# Patient Record
Sex: Female | Born: 1950 | Race: White | Hispanic: No | Marital: Married
Health system: Southern US, Community
[De-identification: ages and names within clinical notes are randomized; demographics above are authoritative.]

---

## 1998-08-10 ENCOUNTER — Other Ambulatory Visit: Admission: RE | Admit: 1998-08-10 | Discharge: 1998-08-10 | Payer: Self-pay | Admitting: *Deleted

## 1999-12-20 ENCOUNTER — Other Ambulatory Visit: Admission: RE | Admit: 1999-12-20 | Discharge: 1999-12-20 | Payer: Self-pay | Admitting: *Deleted

## 2001-03-30 ENCOUNTER — Other Ambulatory Visit: Admission: RE | Admit: 2001-03-30 | Discharge: 2001-03-30 | Payer: Self-pay | Admitting: *Deleted

## 2001-07-07 ENCOUNTER — Encounter: Payer: Self-pay | Admitting: *Deleted

## 2001-07-07 ENCOUNTER — Ambulatory Visit: Admission: RE | Admit: 2001-07-07 | Discharge: 2001-07-07 | Payer: Self-pay | Admitting: Gynecologic Oncology

## 2001-07-14 ENCOUNTER — Inpatient Hospital Stay (HOSPITAL_COMMUNITY): Admission: RE | Admit: 2001-07-14 | Discharge: 2001-07-18 | Payer: Self-pay | Admitting: *Deleted

## 2001-07-14 ENCOUNTER — Encounter (INDEPENDENT_AMBULATORY_CARE_PROVIDER_SITE_OTHER): Payer: Self-pay | Admitting: Specialist

## 2001-07-14 ENCOUNTER — Encounter (INDEPENDENT_AMBULATORY_CARE_PROVIDER_SITE_OTHER): Payer: Self-pay

## 2001-07-21 ENCOUNTER — Ambulatory Visit: Admission: RE | Admit: 2001-07-21 | Discharge: 2001-07-21 | Payer: Self-pay | Admitting: Gynecology

## 2001-07-29 ENCOUNTER — Ambulatory Visit: Admission: RE | Admit: 2001-07-29 | Discharge: 2001-07-29 | Payer: Self-pay | Admitting: Gynecology

## 2001-08-06 ENCOUNTER — Ambulatory Visit (HOSPITAL_COMMUNITY): Admission: RE | Admit: 2001-08-06 | Discharge: 2001-08-06 | Payer: Self-pay | Admitting: Oncology

## 2001-08-06 ENCOUNTER — Encounter: Payer: Self-pay | Admitting: Oncology

## 2001-08-11 ENCOUNTER — Encounter: Payer: Self-pay | Admitting: Oncology

## 2001-08-11 ENCOUNTER — Ambulatory Visit (HOSPITAL_COMMUNITY): Admission: RE | Admit: 2001-08-11 | Discharge: 2001-08-11 | Payer: Self-pay | Admitting: Oncology

## 2001-09-02 ENCOUNTER — Ambulatory Visit: Admission: RE | Admit: 2001-09-02 | Discharge: 2001-09-02 | Payer: Self-pay | Admitting: Gynecology

## 2001-10-20 ENCOUNTER — Ambulatory Visit: Admission: RE | Admit: 2001-10-20 | Discharge: 2001-10-20 | Payer: Self-pay | Admitting: Gynecology

## 2001-11-17 ENCOUNTER — Other Ambulatory Visit: Admission: RE | Admit: 2001-11-17 | Discharge: 2001-11-17 | Payer: Self-pay | Admitting: Oncology

## 2001-11-25 ENCOUNTER — Ambulatory Visit (HOSPITAL_COMMUNITY): Admission: RE | Admit: 2001-11-25 | Discharge: 2001-11-25 | Payer: Self-pay | Admitting: Oncology

## 2001-11-25 ENCOUNTER — Encounter: Payer: Self-pay | Admitting: Oncology

## 2001-12-03 ENCOUNTER — Ambulatory Visit (HOSPITAL_COMMUNITY): Admission: RE | Admit: 2001-12-03 | Discharge: 2001-12-03 | Payer: Self-pay | Admitting: Oncology

## 2001-12-03 ENCOUNTER — Encounter: Payer: Self-pay | Admitting: Oncology

## 2001-12-15 ENCOUNTER — Ambulatory Visit: Admission: RE | Admit: 2001-12-15 | Discharge: 2001-12-15 | Payer: Self-pay | Admitting: Gynecology

## 2002-01-12 ENCOUNTER — Ambulatory Visit: Admission: RE | Admit: 2002-01-12 | Discharge: 2002-01-12 | Payer: Self-pay | Admitting: Gynecology

## 2002-03-02 ENCOUNTER — Ambulatory Visit: Admission: RE | Admit: 2002-03-02 | Discharge: 2002-03-02 | Payer: Self-pay | Admitting: Oncology

## 2002-03-09 ENCOUNTER — Ambulatory Visit: Admission: RE | Admit: 2002-03-09 | Discharge: 2002-03-09 | Payer: Self-pay | Admitting: Gynecology

## 2002-04-01 ENCOUNTER — Encounter: Payer: Self-pay | Admitting: Oncology

## 2002-04-01 ENCOUNTER — Ambulatory Visit (HOSPITAL_COMMUNITY): Admission: RE | Admit: 2002-04-01 | Discharge: 2002-04-01 | Payer: Self-pay | Admitting: Oncology

## 2002-04-06 ENCOUNTER — Ambulatory Visit: Admission: RE | Admit: 2002-04-06 | Discharge: 2002-04-06 | Payer: Self-pay | Admitting: Gynecology

## 2002-07-05 ENCOUNTER — Encounter: Payer: Self-pay | Admitting: *Deleted

## 2002-07-05 ENCOUNTER — Ambulatory Visit (HOSPITAL_COMMUNITY): Admission: RE | Admit: 2002-07-05 | Discharge: 2002-07-05 | Payer: Self-pay | Admitting: *Deleted

## 2002-07-13 ENCOUNTER — Other Ambulatory Visit: Admission: RE | Admit: 2002-07-13 | Discharge: 2002-07-13 | Payer: Self-pay | Admitting: Oncology

## 2002-07-28 ENCOUNTER — Encounter: Payer: Self-pay | Admitting: Oncology

## 2002-07-28 ENCOUNTER — Ambulatory Visit (HOSPITAL_COMMUNITY): Admission: RE | Admit: 2002-07-28 | Discharge: 2002-07-28 | Payer: Self-pay | Admitting: Oncology

## 2002-10-13 ENCOUNTER — Ambulatory Visit: Admission: RE | Admit: 2002-10-13 | Discharge: 2002-10-13 | Payer: Self-pay | Admitting: Gynecology

## 2003-04-06 ENCOUNTER — Ambulatory Visit: Admission: RE | Admit: 2003-04-06 | Discharge: 2003-04-06 | Payer: Self-pay | Admitting: Gynecology

## 2003-10-05 ENCOUNTER — Ambulatory Visit (HOSPITAL_COMMUNITY): Admission: RE | Admit: 2003-10-05 | Discharge: 2003-10-05 | Payer: Self-pay | Admitting: Oncology

## 2003-10-05 ENCOUNTER — Ambulatory Visit: Admission: RE | Admit: 2003-10-05 | Discharge: 2003-10-05 | Payer: Self-pay | Admitting: Gynecology

## 2003-11-16 ENCOUNTER — Other Ambulatory Visit: Admission: RE | Admit: 2003-11-16 | Discharge: 2003-11-16 | Payer: Self-pay | Admitting: *Deleted

## 2003-12-16 ENCOUNTER — Inpatient Hospital Stay (HOSPITAL_COMMUNITY): Admission: RE | Admit: 2003-12-16 | Discharge: 2003-12-17 | Payer: Self-pay | Admitting: General Surgery

## 2004-07-17 ENCOUNTER — Ambulatory Visit: Admission: RE | Admit: 2004-07-17 | Discharge: 2004-07-17 | Payer: Self-pay | Admitting: Gynecology

## 2004-10-23 ENCOUNTER — Encounter: Admission: RE | Admit: 2004-10-23 | Discharge: 2004-10-23 | Payer: Self-pay | Admitting: *Deleted

## 2004-11-28 IMAGING — CT CT PELVIS W/ CM
1 of 4 series · 13 of 32 positions shown, 18 images · IV contrast (omnipaque)
Comparison: 07/05/02.

CLINICAL DATA: 52-year-old female.  Ovarian carcinoma.  Follow-up exam.
CT ABDOMEN AND PELVIS WITH IV CONTRAST
TECHNIQUE: 5 mm collimation was utilized to scan the abdomen and pelvis helically following the administration of 150 cc of Omnipaque 300 contrast.

[Series 2: abd/pelvis 5.0 b30f · axial · 0.59mm/px · z∈[-450,-50]mm · 13 of 92 slices shown, 18 images]
[im 6/92  soft-tissue]
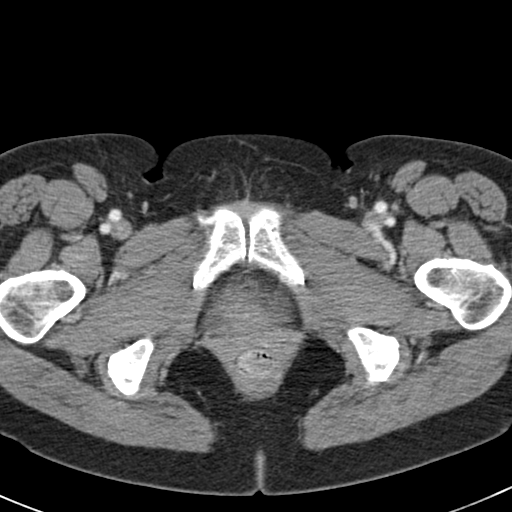
[im 6/92  bone]
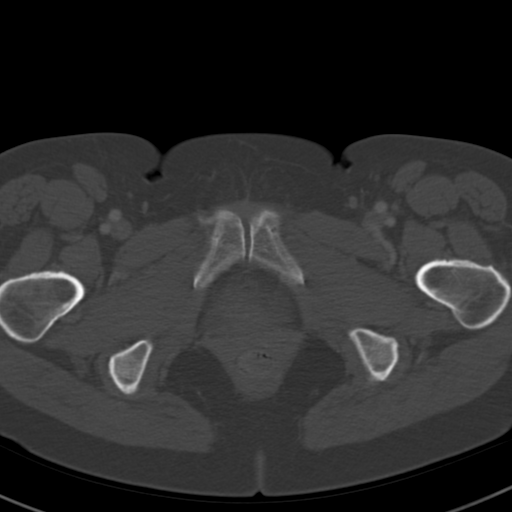
[im 17/92  soft-tissue]
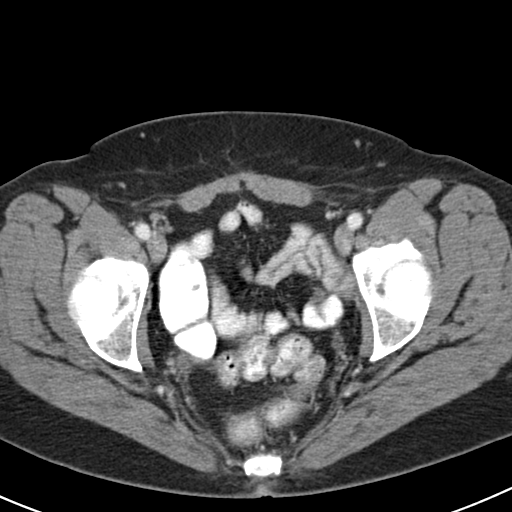
[im 22/92  soft-tissue]
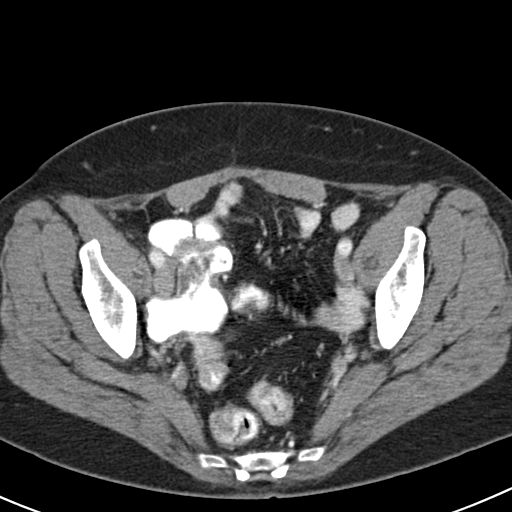
[im 27/92  soft-tissue]
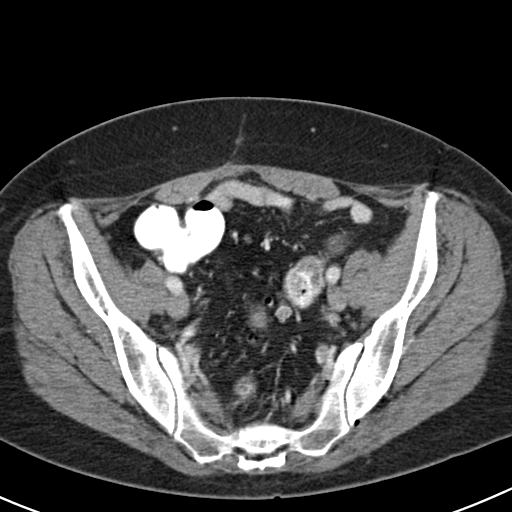
[im 38/92  soft-tissue]
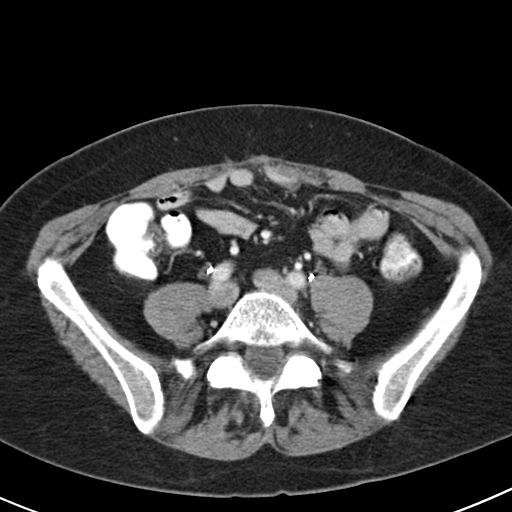
[im 43/92  soft-tissue]
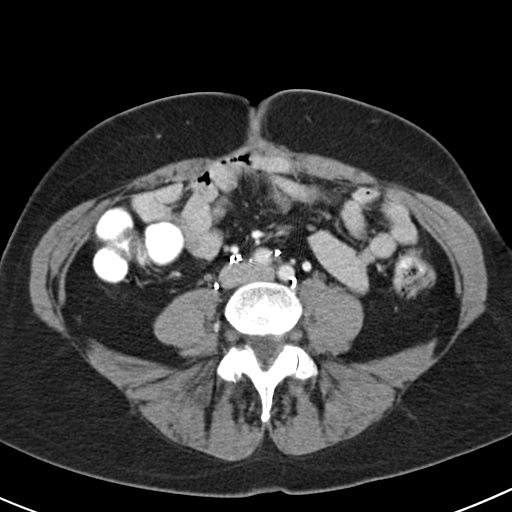
[im 49/92  soft-tissue]
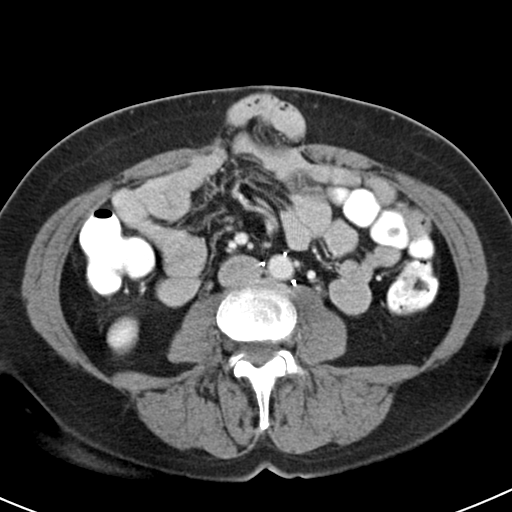
[im 59/92  soft-tissue]
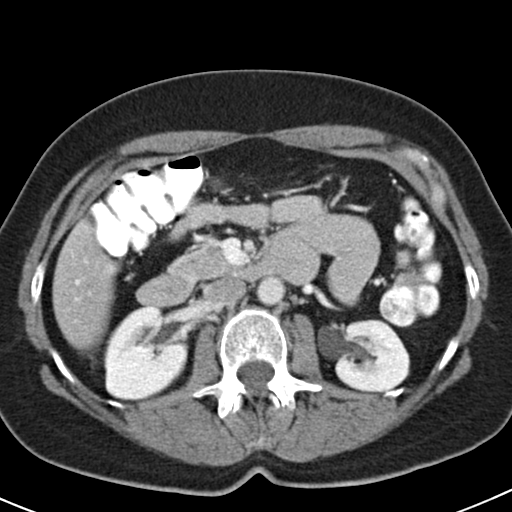
[im 65/92  soft-tissue]
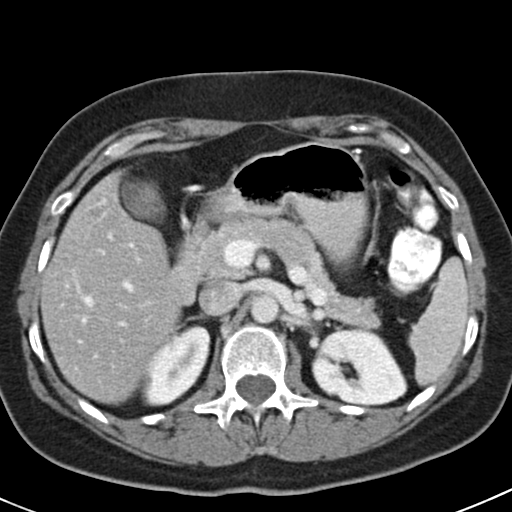
[im 65/92  bone]
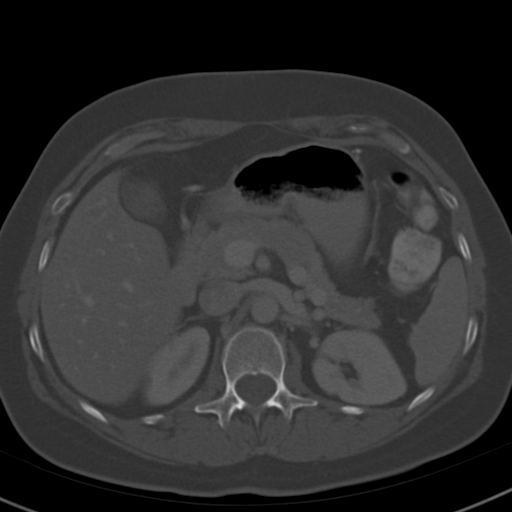
[im 70/92  soft-tissue]
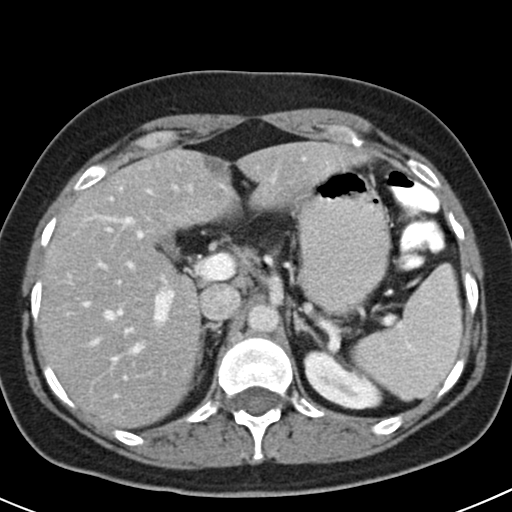
[im 70/92  lung]
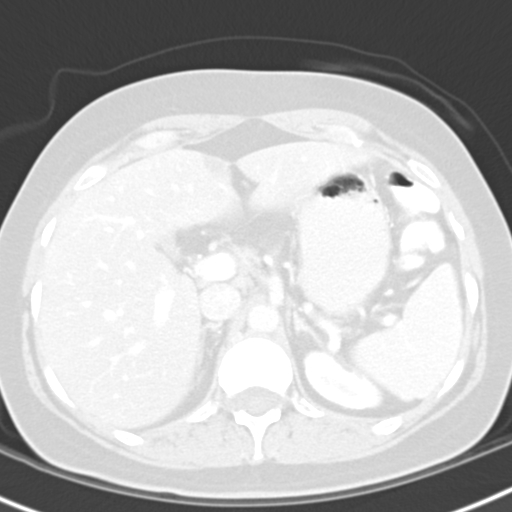
[im 75/92  lung]
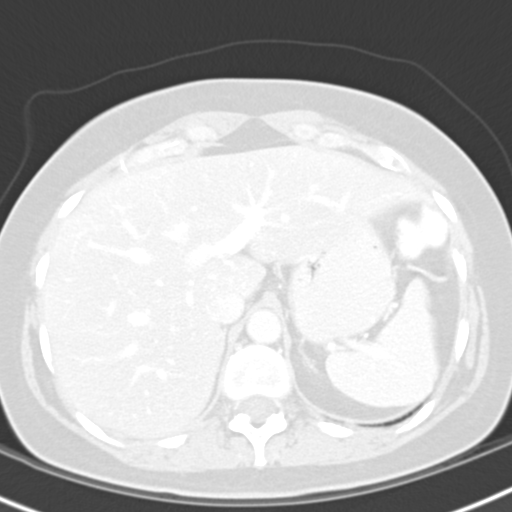
[im 81/92  soft-tissue]
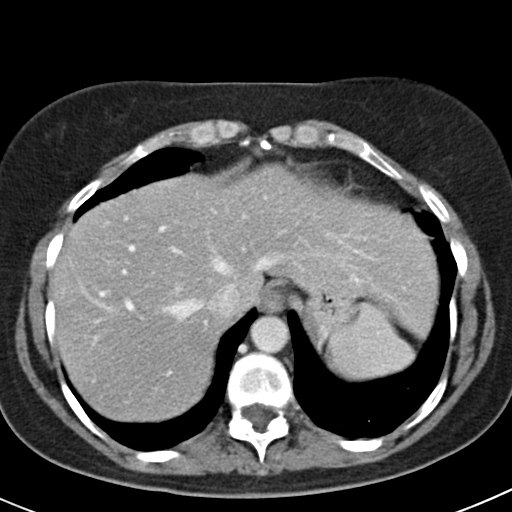
[im 81/92  lung]
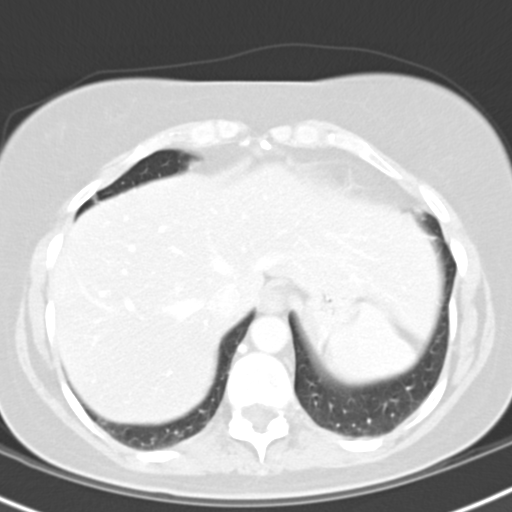
[im 86/92  soft-tissue]
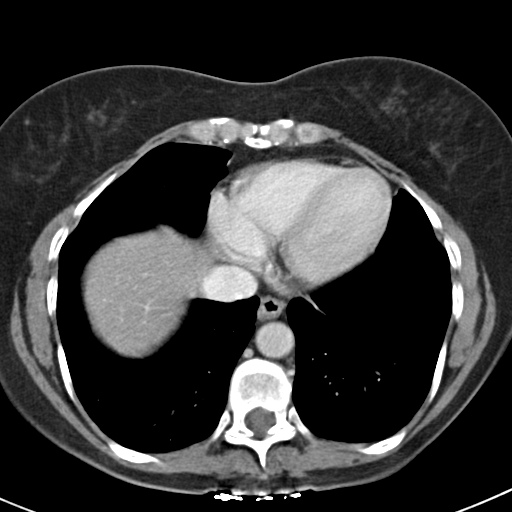
[im 86/92  lung]
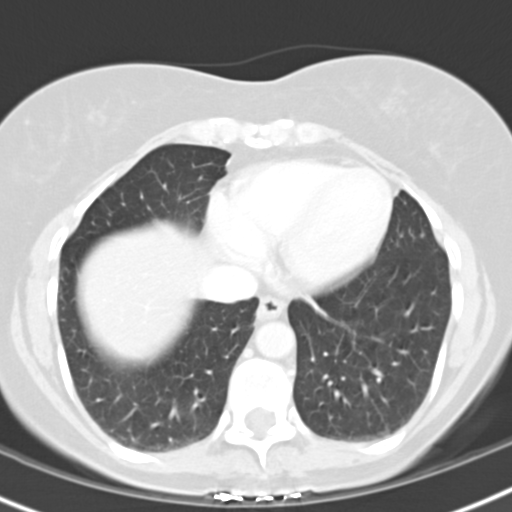

[13 of 32 positions shown; findings below may reference images not displayed]

FINDINGS: ABDOMEN WITH CONTRAST
Lung bases demonstrate minor posterior dependent atelectasis.  No pericardial or pleural fluid.  The heart size is normal.  
In the abdomen, the liver demonstrates diffuse low attenuation, but to a lesser degree compared to 07/05/02, consistent with improving fatty infiltration.  Hepatic and portal veins are patent.  No biliary dilatation.  Along the ligamentum teres in the medial segment of the left hepatic lobe, image 22, there is a subcapsular low attenuation area of the liver parenchyma consistent with focal fatty deposition.  This is stable in appearance.  The spleen, adrenal glands, pancreas, biliary system, and gallbladder are all normal.  Kidneys demonstrate symmetric contrast excretion.  Bilateral extrarenal pelves are noted.  In the lower midline of the abdomen, there is a ventral hernia containing a portion of small bowel without obstruction.  The hernia has a diameter of approximately 5-6 cm.  Postoperative clips are present from retroperitoneal lymph node dissection about the aorta and IVC.  There is no evidence of ascites, definite lymphadenopathy, or mesenteric/omental thickening. 
IMPRESSION
1.  Improving fatty infiltration of the liver with focal fatty deposition along the ligamentum teres.  
2.  Midline ventral hernia containing small bowel, but no obstruction.
3.  Postoperative changes as described.
CT PELVIS WITH CONTRAST 
Postoperative changes are present from a total hysterectomy.  There is improvement in the previous rectosigmoid colon wall thickening.  The bowel appears normal.  There is minor diverticulosis.  No ascites or lymphadenopathy.  There are stable small inguinal lymph nodes bilaterally.  
IMPRESSION
No acute finding in the pelvis.  Stable exam.

## 2005-01-04 ENCOUNTER — Ambulatory Visit: Payer: Self-pay | Admitting: Hematology & Oncology

## 2005-02-18 ENCOUNTER — Other Ambulatory Visit: Admission: RE | Admit: 2005-02-18 | Discharge: 2005-02-18 | Payer: Self-pay | Admitting: *Deleted

## 2005-07-04 ENCOUNTER — Ambulatory Visit: Payer: Self-pay | Admitting: Oncology

## 2005-07-12 ENCOUNTER — Ambulatory Visit: Admission: RE | Admit: 2005-07-12 | Discharge: 2005-07-12 | Payer: Self-pay | Admitting: Gynecology

## 2005-12-17 IMAGING — CT CT ABDOMEN W/ CM
1 of 3 series · 13 of 32 positions shown, 18 images · IV contrast (GASTRO. & [ID] OMNI 300)
Comparison: CT abdomen and pelvis [HOSPITAL] 10/05/2003.

CLINICAL DATA: History of ovarian cancer. Persistent abdominal and pelvic pain.

CT ABDOMEN AND PELVIS WITH CONTRAST 10/23/2004:
TECHNIQUE: Multidetector helical CT of the abdomen and pelvis was performed
during bolus administration of intravenous contrast. Oral contrast was given.
Delayed imaging through the kidneys was performed.
Contrast:  100 cc Cmnipaque-NII.

[Series 2: — · axial · 0.70mm/px · z∈[-391,+9]mm · 13 of 90 slices shown, 18 images]
[im 5/90  soft-tissue]
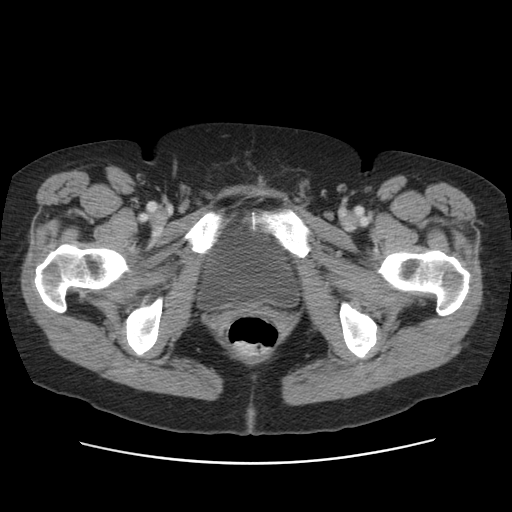
[im 5/90  bone]
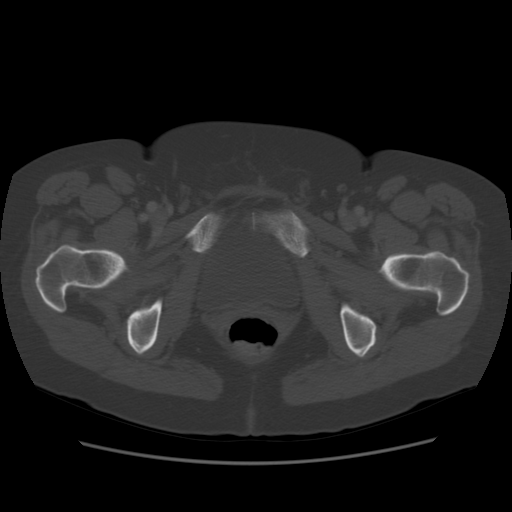
[im 14/90  soft-tissue]
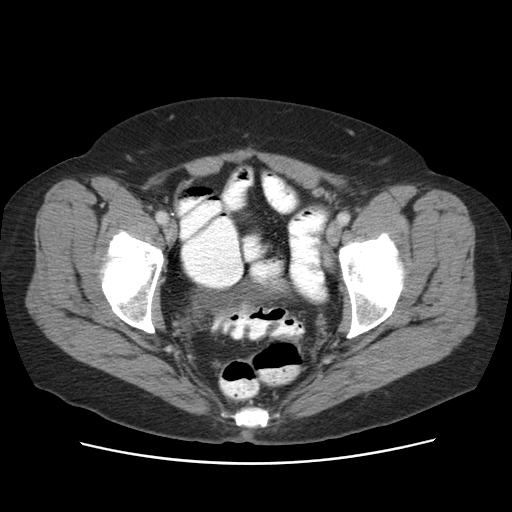
[im 18/90  soft-tissue]
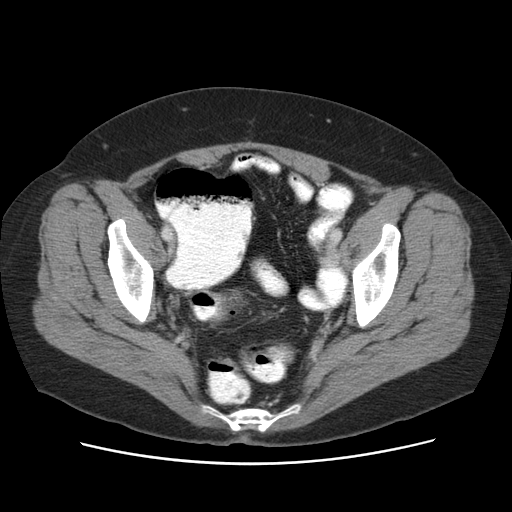
[im 27/90  soft-tissue]
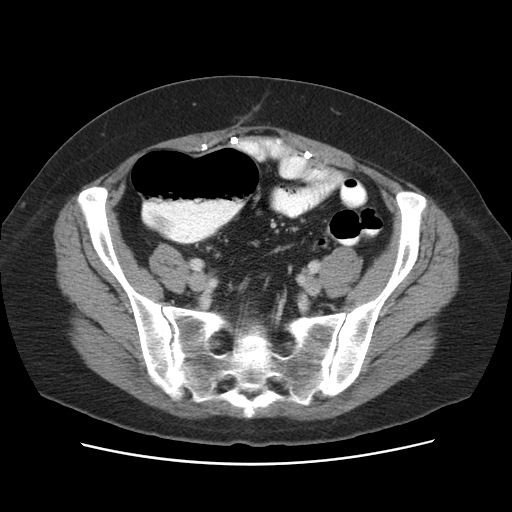
[im 36/90  soft-tissue]
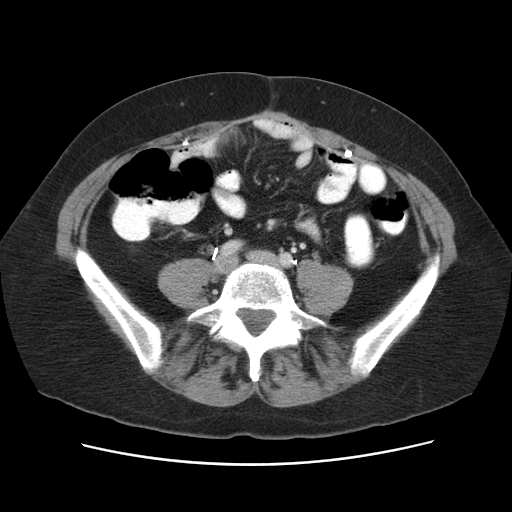
[im 41/90  soft-tissue]
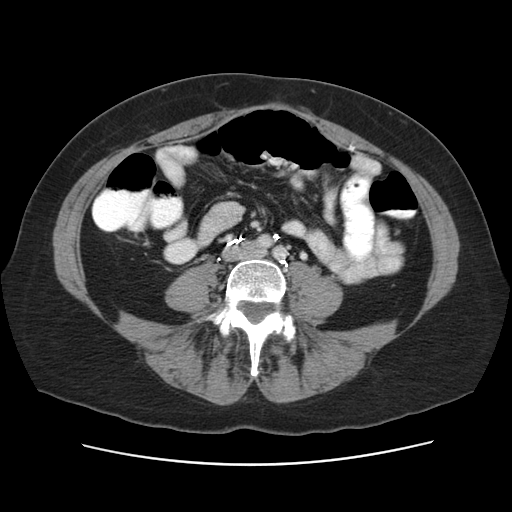
[im 49/90  soft-tissue]
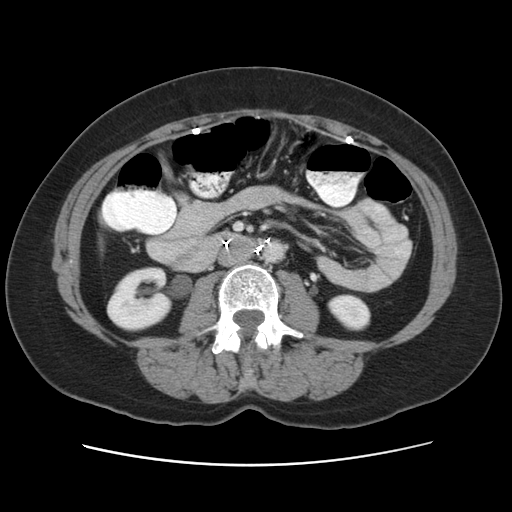
[im 54/90  soft-tissue]
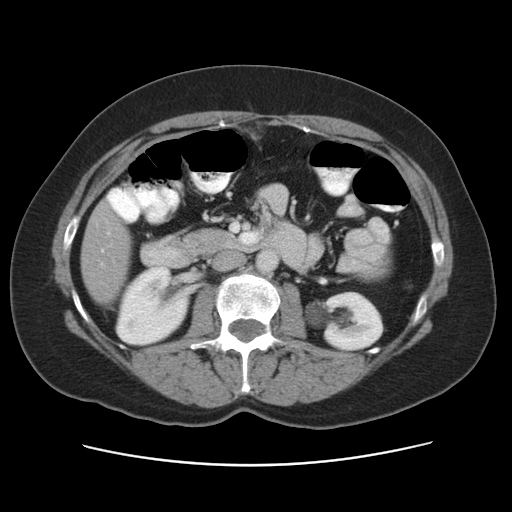
[im 63/90  soft-tissue]
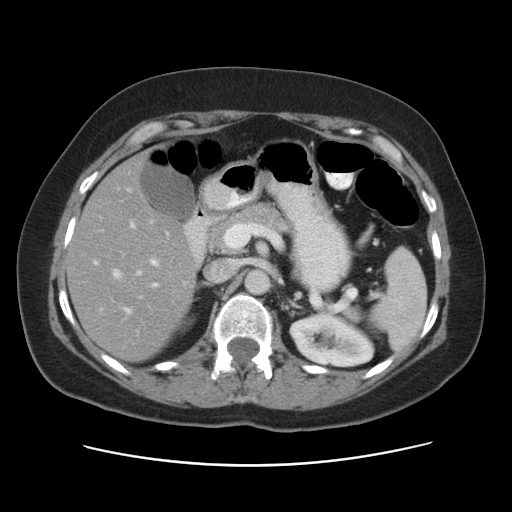
[im 63/90  bone]
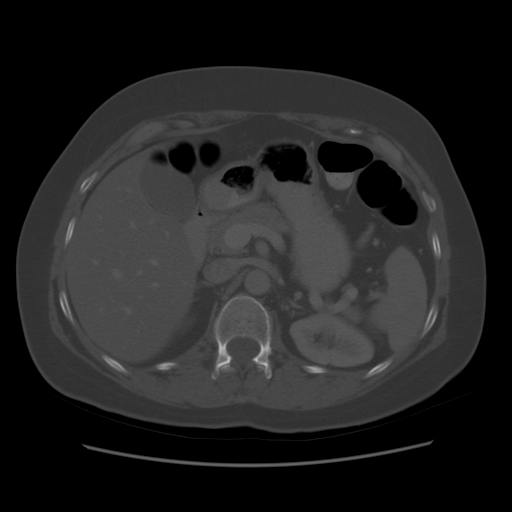
[im 72/90  soft-tissue]
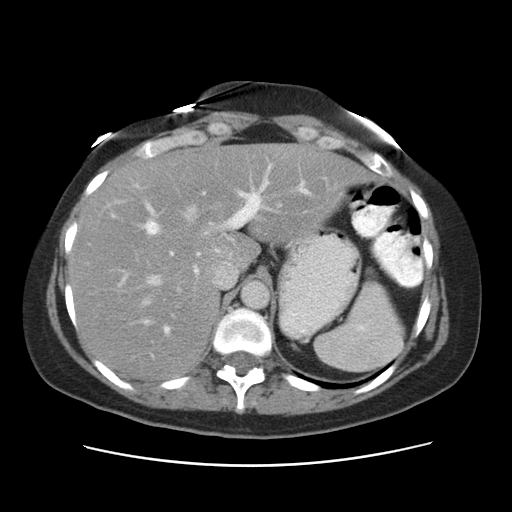
[im 72/90  lung]
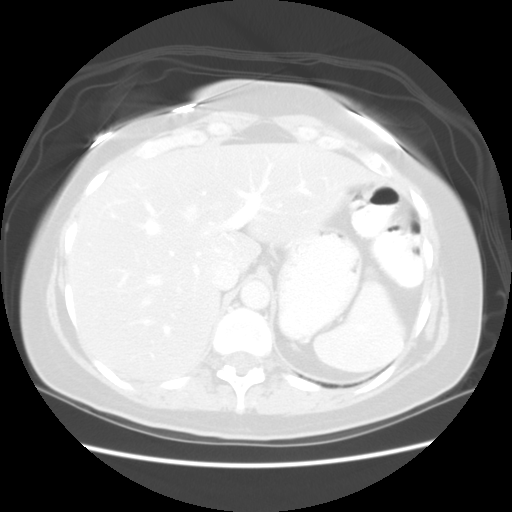
[im 76/90  soft-tissue]
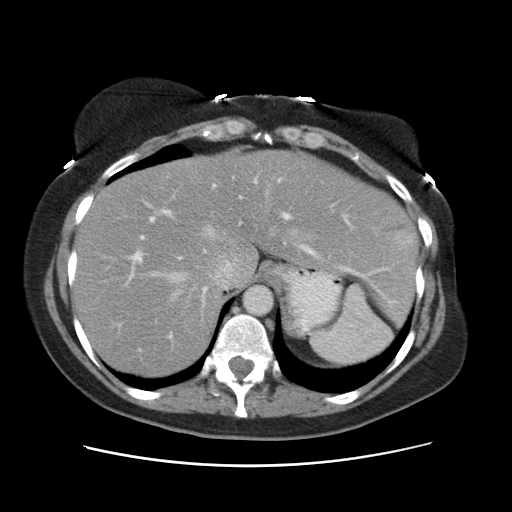
[im 76/90  lung]
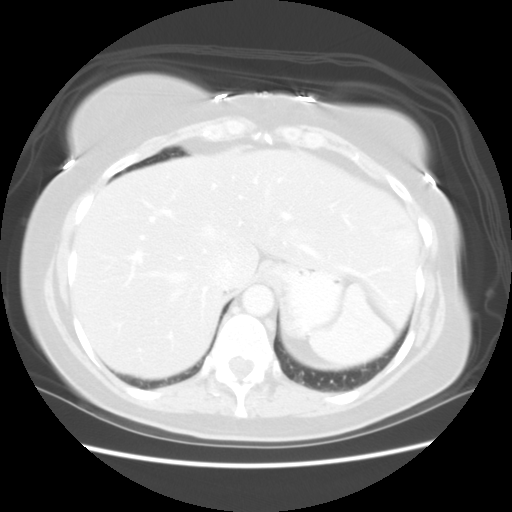
[im 81/90  lung]
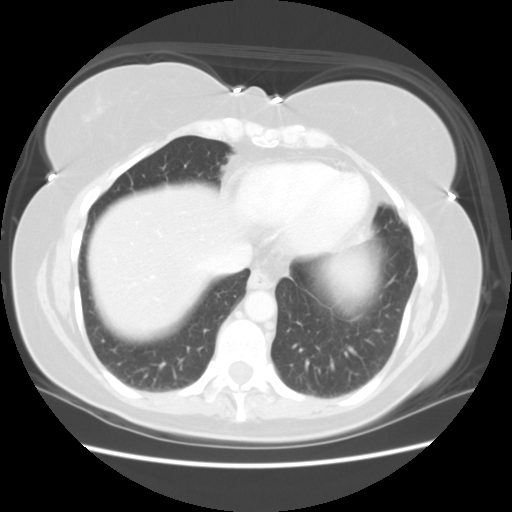
[im 85/90  soft-tissue]
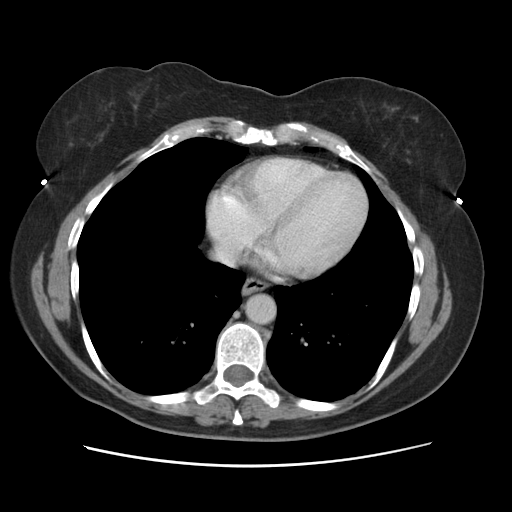
[im 85/90  lung]
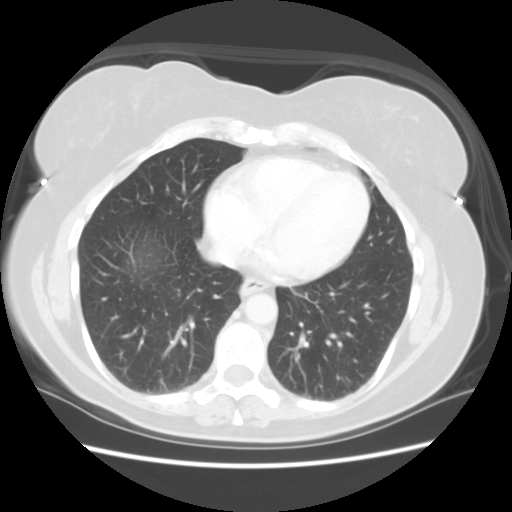

[13 of 32 positions shown; findings below may reference images not displayed]

CT ABDOMEN:

Diffuse fatty infiltration of the liver is again demonstrated; no focal hepatic
abnormalities are identified. The spleen is unremarkable; again demonstrated is
a small focus of accessory splenic tissue immediately inferior to the lower tip
of the spleen. The pancreas has a normal appearance. The gallbladder is
unremarkable by CT and there is no biliary ductal dilation. Both adrenal glands
are normal. Both kidneys are unremarkable; extrarenal pelves are again noted
bilaterally. Since the previous CT, the ventral abdominal wall hernia has
resolved; thinning of the anterior rectus sheath is noted. The stomach and the
visualized colon and small bowel are unremarkable in the upper abdomen. Surgical
clips from the retroperitoneal node dissection are again noted. There are mildly
enlarged lymph nodes in the portacaval region which are unchanged, the largest
measuring approximately 1.5 x 0.8 cm. Subcentimeter nodes are present in the
retroperitoneum. No significant lymphadenopathy is identified. There is no
ascites. The visualized lung bases appear clear apart from minimal left basilar
scarring.
IMPRESSION: 1. No acute abnormalities in the abdomen.

2. Diffuse fatty infiltration of the liver, stable.

3. Resolution of the ventral abdominal wall hernia since 10/05/2003.

CT PELVIS:

The patient has had ventral abdominal wall hernia repair with mesh. There is no
evidence of recurrent hernia in the pelvis. Surgical clips from the
retroperitoneal node dissection are again noted. There is no evidence of a
recurrent mass in the pelvis. There is no ascites. The urinary bladder is
normal. The vaginal cuff is normal. There is no significant lymphadenopathy. The
colon and small bowel are unremarkable apart from sigmoid diverticulosis.
IMPRESSION: 1. No acute abnormalities in the pelvis.

2. Sigmoid diverticulosis.

## 2006-01-17 ENCOUNTER — Ambulatory Visit: Payer: Self-pay | Admitting: Oncology

## 2006-06-11 ENCOUNTER — Other Ambulatory Visit: Admission: RE | Admit: 2006-06-11 | Discharge: 2006-06-11 | Payer: Self-pay | Admitting: *Deleted

## 2006-07-22 ENCOUNTER — Ambulatory Visit: Admission: RE | Admit: 2006-07-22 | Discharge: 2006-07-22 | Payer: Self-pay | Admitting: Gynecology

## 2007-01-21 ENCOUNTER — Ambulatory Visit: Payer: Self-pay | Admitting: Oncology

## 2007-08-28 ENCOUNTER — Ambulatory Visit: Admission: RE | Admit: 2007-08-28 | Discharge: 2007-08-28 | Payer: Self-pay | Admitting: Gynecology

## 2010-10-20 ENCOUNTER — Encounter: Payer: Self-pay | Admitting: *Deleted

## 2011-02-12 NOTE — Consult Note (Signed)
NAMEMAELEIGH, [name]               ACCOUNT NO.:  [id number]   MEDICAL RECORD NO.:  [id number]          PATIENT TYPE:  OUT   LOCATION:  GYN                          FACILITY:  [facility]   PHYSICIAN:  De Blanch, M.D.DATE OF BIRTH:  11-26-1950   DATE OF CONSULTATION:  DATE OF DISCHARGE:                                 CONSULTATION   CHIEF COMPLAINT:  Ovarian cancer.   INTERVAL HISTORY:  Since her last visit, the patient has done reasonably  well.  She has continued to have intermittent abdominal discomfort and  reports that she had another CT scan obtained by her primary physician  recently.  Apparently the CT scan was negative.  She has been diagnosed  as having gastroesophageal reflux disease and has been replaced on  Aciphex for treatment of this.  She has no other GI or GU symptoms.  She  has no pelvic pain, pressure, vaginal bleeding or discharge.  Functional  status remains excellent.  She had mammograms done recently that were  negative.   HISTORY OF PRESENT ILLNESS:  Stage IIB ovarian cancer, undergoing  initial surgery in 2003.  She was treated on GOG protocol #175.  She  received maintenance Taxol, chemotherapy completed in July 2003.  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   CURRENT MEDICATIONS:  Vivelle estrogen patch, Aciphex, Benicar, Tricor,  p.r.n. Motrin, fish oil, calcium.   PAST SURGICAL HISTORY:  1. Ovarian cancer and staging and debulking.  2. Laparoscopic ventral hernia repair.   FAMILY HISTORY:  Negative for gynecologic breast or colon cancer.   SOCIAL HISTORY:  The patient is married.  She does not smoke.   REVIEW OF SYSTEMS:  Ten point comprehensive review of systems is  negative except as noted above.   PHYSICAL EXAMINATION:  VITAL SIGNS:  Weight 172 pounds, blood pressure  122/84, pulse 80, respirations 20.  GENERAL:  The patient is a healthy white female in no acute distress.  HEENT:   Negative.  NECK:  Supple without thyromegaly.  There is no supraclavicular or  inguinal adenopathy.  BREASTS:  Without masses, discharge, or skin changes.  She does have a  healing site on her left chest where she recently had a skin lesion  cauterized by a dermatologist.  ABDOMEN:  Soft, nontender.  No mass, megaly, ascites or hernias noted.  PELVIC:  EG/BUS, bladder and urethra are normal.  Cervix and uterus are  surgically absent.  Adnexa without masses.  Rectovaginal exam confirms.  LOWER EXTREMITIES:  Without edema or varicosities.   IMPRESSION:  Stage IIB ovarian cancer in 2003, no evidence of recurrent  disease.  CA-125 obtained on November 18 was 7.2 units/mL.  The  remainder of her laboratory work looks good.   Now that she is completed 5 years of follow-up, we will return her to  the care of Dr. Randell Patient and would suggest that she have annual exams and  continuing CA-125 values.      De Blanch, M.D.  Electronically Signed     DC/MEDQ  D:  08/28/2007  T:  08/28/2007  Job:  [id number]   cc:   Almedia Balls. Randell Patient, M.D.  Fax: [phone]   Telford Nab, R.N.  [address]  [place], [place] [address]   Griffith Citron, M.D.  Fax: [phone]   [name], M.D.  Fax: [phone]   Wilmer Floor  Fax: [phone]   Loistine Chance  Fax: [phone]

## 2011-02-15 NOTE — Consult Note (Signed)
[facility]  Patient:    [name], [name] Visit Number: [id number] MRN: [id number]          Service Type: GON Location: GYN Attending Physician:  Jeannette Corpus Dictated by:   Rande Brunt. Clarke-Pearson, M.D. Proc. Date: 12/15/01 Admit Date:  12/15/2001 Discharge Date: 12/15/2001                            Consultation Report  REASON FOR CONSULTATION:  The patient is a 60 year old white female returns for continuing followup of a stage IIB ovarian cancer.  She underwent initial surgery in October 2002, and was subsequently treated on GOG protocol 175, completing three cycles of carboplatin and Taxol chemotherapy.  She is randomized to receive 24 additional weeks of low dose Taxol.  The patient has currently received nine weekly doses of Taxol, seems to be tolerating it well.  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  She has some nausea, especially in the evening. Otherwise, she seems to be functioning quite well.  PHYSICAL EXAMINATION:  VITAL SIGNS:  Weight 152 pounds.  ABDOMEN:  Soft, nontender, no masses, organomegaly, ascites, or hernias are noted.  PELVIC:  EGBUS normal.  Vagina is clean and well supported.  Bimanual and rectovaginal examination reveal no masses, induration, or nodularity.  IMPRESSION:  Stage IIB ovarian cancer, no evidence of recurrent disease. CA125 is obtained today.  PLAN:  She will return to see [place] in one month for continuing followup. Dictated by:   Rande Brunt. Clarke-Pearson, M.D. Attending Physician:  Jeannette Corpus DD:  12/15/01 TD:  12/17/01 Job: [id number]

## 2011-02-15 NOTE — Consult Note (Signed)
NAME:  MALASIA, TORAIN NO.:  [id number]   MEDICAL RECORD NO.:  [id number]                   PATIENT TYPE:  OUT   LOCATION:  GYN                                  FACILITY:  [facility]   PHYSICIAN:  De Blanch, M.D.         DATE OF BIRTH:  [birth year]   DATE OF CONSULTATION:  DATE OF DISCHARGE:                                   CONSULTATION   REASON FOR CONSULTATION:  A 60 year old white female returns for continuing  followup of a stage IIB ovarian cancer.  She underwent initial staging  followed by adjuvant chemotherapy using carboplatin and Taxol.  She then  received adjuvant Taxol maintenance therapy completed in July 2003, on GOG  protocol #175.   Since last visit, she has done well.  She has noted increased discomfort in  her ventral hernia and has some gastroesophageal reflux disease for which  she takes Prilosec p.r.n.  She has arthritis which seems to have gotten  worse since she has had to discontinue the use of Vioxx.   CURRENT MEDICATIONS:  1. Premarin 0.625 mg.  2. Triamterene/hydrochlorothiazide.  3. Sinemet (for restless leg prescribed by her primary physician).  4. Citracal.  5. She uses Prilosec, Allegra D, Ambien, and Aleve as needed.   HISTORY OF PRESENT ILLNESS:  The patient specifically denies any GI or GU  symptoms.  Has no pelvic pain, pressure, vaginal bleeding, or discharge.  Her functional status is excellent.  She has been on a diet, and is happy  with her significant weight loss over the last six months.  She saw Dr.  Darnelle Catalan in October 2004, at which time her CA-125 value was 8.8 units/ml.  She is scheduled to have a CT scan later this morning.   REVIEW OF SYSTEMS:  Otherwise negative.   FAMILY HISTORY:  Reviewed and unchanged from previous notations.   SOCIAL HISTORY:  Reviewed and unchanged from previous notations.   PHYSICAL EXAMINATION:  VITAL SIGNS:  Weight 164 pounds, blood pressure  160/88.  GENERAL:   The patient is a healthy white female in no acute distress.  HEENT:  Negative.  NECK:  Supple without thyromegaly.  There is no supraclavicular or inguinal  adenopathy.  ABDOMEN:  Soft and nontender.  No masses, organomegaly, or ascites are  noted.  She does have a palpable and easily reducible ventral hernia at the  apex of her midline incision.  PELVIC:  EGBUS, vagina, bladder, and urethra are normal.  Bimanual and  rectovaginal exam reveal no masses, induration, or nodularity.   IMPRESSION:  Stage IIB ovarian cancer, no evidence of recurrent disease.  CT  scan is pending, as well as a CA-125.  If these are normal, she will return  to see Dr. Darnelle Catalan in three months and see [place] in six months.  She is  encouraged to see a general surgeon regarding repair of the ventral hernia.  She is given the name of  Dr. Avel Peace.                                               De Blanch, M.D.    DC/MEDQ  D:  10/05/2003  T:  10/05/2003  Job:  [id number]   cc:   Almedia Balls. Fore, M.D.  [phone] [address] [place] [address]  [place]  [place] [address]  Fax: [phone]   [name], M.D.  [address] [facility]  [place]  [place] [address]  Fax: [phone]   Telford Nab, R.N.  [phone] [address]  [place], [place] [address]   Wilmer Floor  [address]  [place]  [place] [address]  Fax: [phone]

## 2011-02-15 NOTE — Consult Note (Signed)
NAME:  [name], [name]                         ACCOUNT NO.:  [id number]   MEDICAL RECORD NO.:  [id number]                   PATIENT TYPE:  OUT   LOCATION:  GYN                                  [facility]   PHYSICIAN:  De Blanch, M.D.         DATE OF BIRTH:  Aug 02, 1951   DATE OF CONSULTATION:  04/06/2003  DATE OF DISCHARGE:                                   CONSULTATION   HISTORY OF PRESENT ILLNESS:  A 60 year old white female returns for  continuing follow-up of a stage IIB ovarian cancer.  She was initially  treated with surgical staging followed by adjuvant chemotherapy using  carboplatin and Taxol.  She was treated on GOG protocol #175 and received  adjuvant maintenance Taxol therapy completed in July of 2003.   Since her last visit she has done well.  She denies any GI or GU symptoms.  There is no pelvic pain, pressure, vaginal bleeding, or discharge.  Her  functional status is excellent, although it is noted she has been gaining  weight.   CURRENT MEDICATIONS:  Same as noted on the visit of October 13, 2002.   REVIEW OF SYSTEMS:  Also reviewed and unchanged from previous notations.   FAMILY HISTORY:  Also reviewed and unchanged from previous notations.   SOCIAL HISTORY:  Also reviewed and unchanged from previous notations.   PHYSICAL EXAMINATION:  VITAL SIGNS:  Weight 178 pounds (up 14 pounds since  January 2004).  GENERAL:  The patient is a healthy, pleasant white female in no acute  distress.  HEENT:  Negative.  NECK:  Supple without thyromegaly.  LYMPH:  There is no supraclavicular or inguinal adenopathy.  ABDOMEN:  Soft, nontender.  No masses, organomegaly, ascites, or hernias are  noted.  PELVIC:  EGBUS, vagina, bladder, urethra are normal.  Cervix and uterus  surgically absent.  Adnexa without masses, induration, or nodularity.  Rectovaginal examination confirms.  EXTREMITIES:  Lower extremities without edema or varicosities.   IMPRESSION:   Stage IIB ovarian cancer.  No evidence of disease.   The patient's laboratory work has been reviewed.  CA-125 is 7.5 units/mL.  The only abnormality is a nonfasting glucose of 156.   PLAN:  The patient will return to see [name], M.D. as scheduled  by protocol in October 2004.   We will repeat a fasting blood sugar this morning and if there is any  abnormality refer her to her primary care physician.   The patient is encouraged to enter a weight reduction program so as to avoid  continued weight gain.                                               De Blanch, M.D.    DC/MEDQ  D:  04/06/2003  T:  04/06/2003  Job:  [id number]   cc:   Telford Nab, R.N.  [address]  [place], [place] [address]   [name], M.D.  [address] [facility]  [place]  [place] [address]  Fax: [phone]   Almedia Balls. Fore, M.D.  [phone] [place]  [place] [address]  Fax: [phone]   [facility] Protocol Office   [address]  [address]  [place]  [place] [address]  Fax: [phone]

## 2011-02-15 NOTE — Consult Note (Signed)
[name], [name]               ACCOUNT NO.:  [id number]   MEDICAL RECORD NO.:  [id number]          PATIENT TYPE:  OUT   LOCATION:  GYN                          FACILITY:  [facility]   PHYSICIAN:  De Blanch, M.D.DATE OF BIRTH:  1951-03-23   DATE OF CONSULTATION:  07/22/2006  DATE OF DISCHARGE:                                   CONSULTATION   CHIEF COMPLAINT:  Ovarian cancer, abdominal pain and discomfort.   INTERVAL HISTORY:  Since her last visit, the patient has done well.  She saw  Dr. Darnelle Catalan approximately six months ago.  She has had chronic abdominal  pain and underwent a CT scan ordered by her primary physician recently.  CT  scan is interpreted as showing bowel loops stuck to the anterior abdominal  wall consistent with adhesions.  No other lesions are noted.  The patient  describes the abdominal pain as diffuse, although she does not really take  significant pain medications for this.  She denies any other GI or GU  symptoms.  She has no pelvic pain, pressure, vaginal bleeding, or discharge.  Her functional status is very good.   HISTORY OF PRESENT ILLNESS:  The patient has a stage IIB ovarian cancer  treated on GOG protocol, number 175.  She received maintenance Taxol  chemotherapy completed in July 2003.  She had mild residual peripheral  neuropathy at that time.   CURRENT MEDICATIONS:  Potassium, Lasix, AcipHex, Etodolac, Vival estrogen  patch, Gabapentin (the patient reports she uses this on a p.r.n. basis).  She also takes a number of other supplements and vitamins.   PAST SURGICAL HISTORY:  Ovarian cancer staging and debulking.  Laparoscopic  ventral hernia repair.   FAMILY HISTORY:  Negative for gynecologic, breast, or colon cancer.   SOCIAL HISTORY:  The patient is married, she does not smoke, she comes  accompanied by her husband today.   REVIEW OF SYMPTOMS:  Ten point comprehensive review of systems negative  except as noted above.   PHYSICAL  EXAMINATION:  VITAL SIGNS:  Weight 164 pounds, blood pressure 128/70, pulse 70,  respiratory rate 18.  GENERAL:  The patient is a healthy white female in no acute distress.  HEENT:  Negative.  NECK:  Supple without thyromegaly.  There is no supraclavicular or inguinal  adenopathy.  ABDOMEN:  The abdomen is soft, nontender, no masses, organomegaly, or  herniae are noted.  I am not able to elicit any tenderness and she certainly  does not have any rebound.  PELVIC:  EG/BUS, vagina, and urethra are normal.  The cervix and uterus are  surgically absent.  Adnexa without masses.  Rectovaginal exam confirms.  EXTREMITIES:  Lower extremities without edema or varicosities.   IMPRESSION:  Stage IIB ovarian cancer, no evidence of recurrent disease.  CA-  125 will be obtained today.   I had a lengthy discussion with the patient regarding her abdominal pain and  the CT scan findings.  We discussed the pros and cons of attempting to lyse  adhesions.  In general, I would be not inclined to consider that as an  option given the fact that it is likely the adhesions will reform.  I  explained this to the patient.  She and her husband seemed to have a clear  understanding of this issue.   She will return to see Dr. Darnelle Catalan in six months and return to see me in  one year.  She is given warning signs regarding symptoms associated with a  small bowel obstruction.      De Blanch, M.D.  Electronically Signed     DC/MEDQ  D:  07/22/2006  T:  07/23/2006  Job:  [id number]   cc:   Loistine Chance  Fax: [phone]   Wilmer Floor, M.D.   Telford Nab, R.N.  [address]  [place], [place] [address]   Griffith Citron, M.D.  Fax: [phone]   Almedia Balls. Randell Patient, M.D.  Fax: [phone]   [name], M.D.  Fax: [phone]

## 2011-02-15 NOTE — Consult Note (Signed)
[facility]  Patient:    [name], [name] Visit Number: [id number] MRN: [id number]          Service Type: GON Location: GYN Attending Physician:  Jeannette Corpus Dictated by:   Rande Brunt Clarke-Pearson, M.D. Proc. Date: 09/02/01 Admit Date:  09/02/2001   CC:         [name], M.D.  Almedia Balls. Randell Patient, M.D.  Telford Nab, R.N.   Consultation Report  REASON FOR CONSULTATION:  The patient is a 60 year old white married female who returns for consideration of a second cycle of chemotherapy on GOG protocol #175.  She received her first cycle on August 12, 2001.  She reports she had 3 to 4 days of nausea following this, but then subsequently recovered and felt well.  She has had the expected alopecia occur, and she is wearing a wig and is adjusting to this emotionally.  She denies any peripheral neuropathy, has no other GI or GU symptoms.  She did find that using Phenergan caused her to have a restless leg, but she is doing much better when she uses Zofran.  The patients records are not available to me, but she seems to indicate that she had low white count last week.  They are being repeated today, anticipating chemotherapy if they are adequate as per protocol.  The patients functional status is 0.  REVIEW OF SYSTEMS:  No GI, GU, cardiovascular, pulmonary, or neurologic symptoms.  FAMILY HISTORY:  Reviewed, and unchanged.  SOCIAL HISTORY:  Reviewed, and unchanged.  She comes accompanied by her husband today.  She had family Thanksgiving, and cooked the [place] herself, and had assistance with other cooking preparations from other members of her family.  She seems to be functionally doing quite well.  PHYSICAL EXAMINATION:  VITAL SIGNS:  Weight 145 pounds, blood pressure 142/90, pulse 80, respiratory rate 18.  GENERAL:  The patient is a healthy white female in no acute distress.  HEENT:  Negative.  NECK:  Supple  without thyromegaly.  There is no supraclavicular or inguinal adenopathy.  ABDOMEN:  Soft, nontender, no masses, no organomegaly, ascites, or hernias are noted.  Her midline incision is well healed.  EXTREMITIES:  Lower extremities without edema or varicosities.  PELVIC:  EGBUS normal.  Vagina is clean, well supported.  The vaginal cuff is well healed.  Bimanual and rectovaginal examination reveal no masses, induration, or nodularity.  IMPRESSION:  Stage IIB adenocarcinoma of the ovary, status post one cycle of carboplatin and Taxol on GOG protocol 175.  She has been randomized through the arm to receive 24 additional weeks of Taxol chemotherapy.  CBC with differential, creatinine, CA125, and urinalysis will be obtained today, and checked before deciding as to whether she can go ahead with next cycle of chemotherapy.  All of the patients questions are answered.  From a surgical point of view, she can return to full levels of activity. Dictated by:   Rande Brunt. Clarke-Pearson, M.D. Attending Physician:  Jeannette Corpus DD:  09/02/01 TD:  09/02/01 Job: [id number]

## 2011-02-15 NOTE — Consult Note (Signed)
[facility]  [name]:    [name], [name] Visit Number: [id number] MRN: [id number]          Service Type: GON Location: GYN Attending Physician:  Jeannette Corpus Dictated by:   Rande Brunt. Clarke-Pearson, M.D. Proc. Date: 04/06/02 Admit Date:  04/06/2002 Discharge Date: 04/06/2002   CC:         [name] [name], M.D.  [name], M.D.  Telford Nab, R.N.   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  She completed her 24th week of adjuvant Taxol chemotherapy on March 30, 2002.  Since her last visit a month ago, she has done well.  She has had some aching in her joints.  She has no neuropathy in her hands and minimal neuropathy in her feet.  She denies any other GI or GU symptoms.  Family history and social history were reviewed and unchanged.  REVIEW OF SYSTEMS:  Negative except as noted above.  PHYSICAL EXAMINATION:  VITAL SIGNS:  Weight 156 pounds, blood pressure 110/76.  GENERAL:  The [name] is a healthy white female in no acute distress.  HEENT:  Negative.  NECK:  Supple without thyromegaly.  LYMPHATIC:  There is no supraclavicular of inguinal lymphadenopathy.  ABDOMEN:  Soft, nontender.  No mass, organomegaly, ascites, or hernia is noted.  PELVIC:  EG, BUS noted.  Vagina is clean and well-supported.  Bimanual and rectovaginal exam reveal no masses, induration, or nodularity.  EXTREMITIES:  Lower extremities without edema or varicosities on exam today.  IMPRESSION:  Stage II-B ovarian cancer, status post surgical staging and adjuvant chemotherapy on GOG protocol #175.  The [name] is clinically free of disease.  The [name] had a CT scan on April 01, 2002, of the chest, abdomen, and pelvis showing no evidence of metastatic or recurrent  disease.  PLAN:  The [name] will have protocol laboratory studies today, including CA125, CBC, CMET, and urinalysis and magnesium.  She will return to see Dr. Darnelle Catalan on August 12 and return to see him again on October 3 as per protocol.  I will plan on seeing the [name] back again in January 2004. Dictated by:   Rande Brunt. Clarke-Pearson, M.D. Attending Physician:  Jeannette Corpus DD:  04/06/02 TD:  04/08/02 Job: [id number]

## 2011-02-15 NOTE — Consult Note (Signed)
[facility]  Patient:    [name], [name] Visit Number: [id number] MRN: [id number]          Service Type: GON Location: GYN Attending Physician:  Jeannette Corpus Dictated by:   Rande Brunt. Clarke-Pearson, M.D. Proc. Date: 01/12/02 Admit Date:  01/12/2002   CC:         Raymond Gurney C. Magrinat, M.D.  Almedia Balls. Randell Patient, M.D.  Telford Nab, R.N.   Consultation Report  HISTORY:  This 60 year old white female returns for continuing follow up.  She has stage II-B ovarian cancer and is currently receiving weekly Taxol chemotherapy on GOG protocol #175.  She has now received 12 weeks of weekly Taxol of a planned 24 weeks.  She denies any pelvic pain or pressure, any GI or GU symptoms.  She does have some numbness in the ends of her feet.  The patient has also noted that she has had some swelling in her right calf and thigh, and a crampy pain in her right calf.  She denies any trauma to the leg.  FAMILY HISTORY AND SOCIAL HISTORY:  Reviewed and unchanged.  REVIEW OF SYSTEMS:  Negative except as noted above.  PHYSICAL EXAMINATION:  ABDOMEN:  Soft and nontender.  No masses, organomegaly, ascites or hernias are noted.  NECK:  Supple without thyromegaly.  LYMPH:  There is no supraclavicular or inguinal adenopathy.  PELVIC:  EGBUS and vagina are normal.  Bimanual and rectovaginal reveal no masses, induration or nodularity.  EXTREMITIES:   Examination of the lower extremities reveals that the patient has diffuse, mild edema throughout the right lower extremity.  Measuring the calf circumference 13 cm from the lower patellar margin reveals that the right leg measures 38 cm and the left leg 36 cm.  She has a negative Homans sign and there are no cords or erythema.  IMPRESSION: 1. Stage II-B ovarian cancer doing well on chemotherapy.  She will continue to    take weekly Taxol. 2. Lower extremity edema and discomfort, rule out deep venous  thrombosis.  The    patient will be sent to [facility] vascular lab to have Doppler    study this afternoon.  If this is positive, she will go to the emergency    room and be managed by a hematologist.  If it is negative, we will make a    presumptive diagnosis of lymphedema secondary to her lymph node dissection    and place the patient on Aldactazide, leg elevation and compression    stockings.  She will return to see [place] in four weeks for continuing follow    up.  If the patient does have deep venous thrombosis, she has been    instructed to discontinue the use of Premarin. Dictated by:   Rande Brunt. Clarke-Pearson, M.D. Attending Physician:  Jeannette Corpus DD:  01/12/02 TD:  01/12/02 Job: [id number]

## 2011-02-15 NOTE — Op Note (Signed)
[facility]  Mccann:    [name], [name] Visit Number: [id number] MRN: [id number]          Service Type: GYN Location: 4W 0457 02 Attending Physician:  Collene Schlichter Proc. Date: 07/14/01 Admit Date:  07/14/2001   CC:         [name], M.D.  Telford Nab, R.N.   Operative Report  SURGEON:  [name], M.D.  ASSISTANT:  [name], M.D.  PREOPERATIVE DIAGNOSIS:  Left ovarian mass with elevated CA-125 value.  POSTOPERATIVE DIAGNOSIS:  At least stage IIC epithelial ovarian adenocarcinoma.  PROCEDURE:  Exploratory laparotomy with bilateral salpingo-oophorectomy, radical debulking of left pelvic sidewall tumor, multiple biopsies including peritoneum, omentectomy, selective pelvic and para-aortic lymphadenectomies, diaphragm and multiple washings, appendectomy.  ANESTHESIA:  General endotracheal.  DESCRIPTION OF FINDINGS AND INDICATIONS FOR SURGERY:  This 60 year old woman presented with subacute onset of pelvic pain and an enlarging left pelvic complex mass.  CA-125 value was elevated at 179.  Examination under anesthesia confirmed a multilobulated, relatively immobile mass filling the pelvis.  On entry into the abdomen, there was no ascites or carcinomatosis.  A smooth cyst densely adherent to the left sidewall measuring approximately 10 x 8 cm replaced the left ovary.  This was adherent to the sidewall and sigmoid colon. During course of manipulation, there was a small rupture medially at the site where the tumor was most adherent to the left sidewall, and there was a 3 x 3 cm, 1 cm thick plaque of tumor outside of the ovarian capsule at this location.  This was medially overlying the ureter and left uterine artery. Frozen section returned compatible with ovarian adenocarcinoma. Aggressive/radical debulking was performed to isolate the ureter away from the pelvic sidewall so that we could radically debulk the left  pelvic sidewall implant of tumor and surrounding peritoneum.  Adhesions and serosal irregularities of the sigmoid colon were likewise removed.  A comprehensive staging was performed with omentectomy, multiple peritoneal biopsies, bilateral selective pelvic and para-aortic lymphadenectomy and appendectomy.  DESCRIPTION OF PROCEDURE:  The Mccann was prepped and draped in the modified lithotomy position using direct placement stirrups after examination under anesthesia revealed the findings described above.  Exploration was initiated through a midline lower abdominal incision which was extended to the left of the umbilicus into the mid epigastrium after frozen section confirmed ovarian cancer.  The electrocautery was used for hemostasis, and the peritoneum was opened atraumatically.  Manual and visual explorations revealed the findings described above.  Normal saline 100 cc were instilled in the pelvis and collected for cytology.  Bowel was packed out of the pelvis using a Bookwalter retractor.  Left salpingo-oophorectomy was performed, removing a cystogate 10 cm mass of the left ovary.  The left round ligament was divided with electrocautery and posteriorly the broad ligament opened with electrocautery.  The left pararectal space was partially developed, isolating ureter away from the infundibulopelvic ligament.  The ovarian vessels were cross-clamped, divided, and ligated with free-tie of suture ligature 2-0 Vicryl.  Using sharp and blunt dissection, the ovarian tumor was dissected away from its attachments to the sigmoid colon and left sidewall.  In the region of the left uterine artery, there was a small spontaneous rupture, but we were eventually able to mobilize the tumor completely, and it was submitted for frozen section.  Gross tumor plaque approximately 3 x 3 x 1 cm was present, adjacent to the site of capsular rupture in the region of the  left uterine artery.  The left  ureter was densely adherent to the left medial leaf of the broad ligament in this region.  Using sharp and blunt dissection, the pelvic ureter was mobilized away from adherence to the tumor plaque and was mobilized in the ureter tunnel, allowing the left uterine vessels to be reflected above the ureter. With the ureter mobilized laterally, the sidewall peritoneum of the left pelvis was resected.  The uterine vessels were clamped above the ureter, divided, and ligated with 2-0 Vicryl.  This resulted in resection of all gross tumor with the exception of small 2-3 mm serosal implants versus adhesions of the sigmoid colon and its appendix epiploic.  These small lesions were resected using sharp dissection with electrocautery for hemostasis.  When the frozen section returned positive for ovarian malignancy, multiple peritoneal biopsies were obtained from the pelvis including the posterior cul-de-sac, anterior cul-de-sac, and right pelvic sidewall.  The right tube and ovary were removed by opening the broad ligament, isolating the infundibulopelvic ligament away from ureter, cross-clamping and dividing the IP ligament, and controlling the IP ligament hemostatic with a free-tie of suture ligature of 2-0 Vicryl.  Bilateral selective pelvic lymphadenectomies were performed in the following manner:  The pararectal and paravesical spaces bilaterally were fully developed with sharp and blunt dissection and vital structures identified.  Using sharp and blunt dissection, external iliac and obturator lymph nodes were mobilized from the vessels and the pelvic sidewall, using hemoclips and electrocautery for hemostasis.  Throughout the dissection, vessels and obturator nerves were identified and spared.  All lymphatic tissue anterior and medial to the external iliac vessels and from the obturator fossa were harvested.  The midline incision was extended in the mid epigastrium with scalpel  and  electrocautery.  The omentum was mobilized off of its attachment to the transverse colon, and pedicles were developed which were controlled with hemoclips or electrocautery.  The entire dependent portion of the greater omentum was harvested and submitted for pathology.  The paracolic gutter peritoneum bilaterally was elevated, and long strips were harvested using sharp and blunt dissection and electrocautery for hemostasis.  Random biopsies were obtained from the peritoneum of the small bowel mesentery, and a "pinch" biopsy was obtained from the right hemidiaphragm.  Before omentectomy and peritoneal biopsies were obtained, multiple washings were obtained from the subdiaphragmatic regions, right and left gutters, and central abdomen.  These specimens were pooled with the pelvic washings for cytology.  A Pap smear of the right diaphragm was obtained.  The mesoappendix was controlled with electrocautery, base of the ______ , cross-clamped, divided and suture ligated with 2-0 Vicryl.  The abdomen and pelvis were copiously irrigated.  Bowel was free-packed, exposing the lower aorta and vena cava.  An incision was made parallel to the aorta over the right margin, crossing the right common iliac and extending up to the level of the retroperitoneal duodenum. Retroperitoneal retractors were placed, and the right ureter was retracted laterally.  Using sharp and blunt dissection, lymph nodes anterior and lateral to the right common iliac artery and distal aorta were harvested, removing nodes overlying the vena cava.  Hemoclips were used for hemostasis.  The dissection continued across the retroperitoneal space overlying the distal aorta and left common iliac artery.  Lymph nodes anterior and lateral to these vessels were harvested using sharp and blunt dissection with electrocautery for hemostasis.  The abdomen and pelvis were copiously irrigated and all sponges and retractors removed.   Hemostasis was achieved where necessary  with electrocautery.  The abdominal wall was closed with a running Smead-Jones closure of looped 0 PDS to close rectus fascia and muscles in the midline with skin clips for the skin.  The Mccann tolerated the procedure well and was returned to the recovery room in stable condition.  Estimated blood loss 500 cc.   Sponge and instrument counts correct.  Pathology specimens: Bilateral tubes and ovaries, omentum, multiple peritoneal biopsies, appendix, pelvic and para-aortic lymph nodes bilaterally, multiple cytologic specimens. ttending Physician:  Collene Schlichter DD:  07/14/01 TD:  07/14/01 Job: [id number]

## 2011-02-15 NOTE — Discharge Summary (Signed)
[facility]  Patient:    [name], [name] Visit Number: [id number] MRN: [id number]          Service Type: GYN Location: 4W 0457 02 Attending Physician:  Collene Schlichter Dictated by:   Almedia Balls. Randell Patient, M.D. Admit Date:  07/14/2001 Discharge Date: 07/18/2001   CC:         John T. Kyla Balzarine, M.D.  Daniel L. Clarke-Pearson, M.D.   Discharge Summary  HISTORY OF PRESENT ILLNESS:  The patient is a 60 year old with pelvic mass and elevated CA125, status post hysterectomy, who was admitted on July 14, 2001, for exploratory laparotomy and indicated procedure.  The remainder of her history and physical are as previously dictated.  LABORATORY DATA:  Preoperative hemoglobin 11.8 g, 11,900 white blood cells, calcium low at 7.6, glucose 128, potassium low at 3.2.  Chest x-ray was normal.  Electrocardiogram was normal.  A 2-D echocardiogram showed 60% ejection fraction with slightly thickened aortic valve, very slight mitral regurgitation, and tricuspid regurgitation.  HOSPITAL COURSE:  The patient was taken to the operating room on the morning of July 14, 2001, at which time exploratory laparotomy, bilateral salpingo-oophorectomy, omentectomy, pelvic and periaortic lymphadenectomy, multiple pelvic washings, and peritoneal biopsies, as well as appendectomy were performed for what was apparently stage II adenocarcinoma of the ovary involving the left ovary only.  Frozen section confirmed that this was an adenocarcinoma of the ovary, so that extensive further surgery was performed. The patient did well following surgery with gradual progression of her diet and ambulation over several days postoperatively.  She had an extended paralytic ileus secondary to the extensive surgery.  On the morning of July 18, 2001, she was afebrile with a maximum temperature in the preceding 24 hours to 100 degrees, but with remaining temperatures in an afebrile range. It  was felt that she could be discharged at this time.  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  OPERATION:  Exploratory laparotomy, bilateral salpingo-oophorectomy, appendectomy, omentectomy, pelvic and periaortic lymphoidectomy, multiple peritoneal biopsies.  Pathology report showed that the only abnormal area was on the ovary and the peritoneal lesions, making this a surgical stage II adenocarcinoma of the ovary.  FOLLOWUP CARE:  She is to return to see Reuel Boom L. Clarke-Pearson, M.D. on Tuesday, July 21, 2001, for followup and removal of staples, as well as planning for chemotherapy.  ACTIVITY:  She was advised to limit lifting and activity for several weeks. She was fully ambulatory.  DIET:  Regular.  CONDITION ON DISCHARGE:  Good.  DISCHARGE MEDICATIONS: 1. Dilaudid 2 mg tablets #30 to be taken one or two q.4h. p.r.n. pain. 2. She will continue her Premarin 0.3 mg p.r.n. at home. 3. Continue with other medications as prescribed by her primary care    physicians, to include Diozide and potassium.  She will call for any problems.Dictated by:   Almedia Balls Randell Patient, M.D.  Attending Physician:  Collene Schlichter DD:  07/18/01 TD:  07/20/01 Job: [id number]

## 2011-02-15 NOTE — Op Note (Signed)
NAME:  CAMBRI, PLOURDE                         ACCOUNT NO.:  [id number]   MEDICAL RECORD NO.:  [id number]                   PATIENT TYPE:  OBV   LOCATION:  0446                                 FACILITY:  [facility]   PHYSICIAN:  Adolph Pollack, M.D.            DATE OF BIRTH:  Jul 16, 1951   DATE OF PROCEDURE:  12/15/2003  DATE OF DISCHARGE:                                 OPERATIVE REPORT   PREOPERATIVE DIAGNOSIS:  Ventral incisional hernia.   POSTOPERATIVE DIAGNOSIS:  Ventral incisional hernia.   PROCEDURE:  Laparoscopic ventral incisional hernia repair with mesh  (polypropylene with nonadhesive side).   SURGEON:  Adolph Pollack, M.D.   ASSISTANT:  Lorelee New, M.D.   ANESTHESIA:  General.   INDICATION:  Ms. [name] is a 60 year old female, who underwent staging  laparotomy and oophorectomy for stage IIb ovarian cancer back in 2002.  She  had been noticing a bulge in her incision in the periumbilical region, and a  CT scan done for tumor surveillance demonstrated as well.  There is small  bowel up in the hernia.  She now presents for elective laparoscopic repair.  The procedure and the risks were discussed with her preoperatively.   TECHNIQUE:  She was seen in the holding area and then brought to the  operating room, placed supine on the operating table, and a general  anesthetic was administered.  A Foley catheter was placed in the bladder.  The abdominal wall was sterilely prepped and draped.  In the right mid  lateral wall, an incision was made through the skin and subcutaneous tissue  until the fascia was identified and sequential incisions made through the  fascia until the peritoneal cavity was entered.  Stay sutures were placed on  the anterior fascial layer.  A Hasson trocar was introduced in the  peritoneal cavity, and pneumoperitoneum was created by insufflation of CO2  gas.  Next, the laparoscope was introduced.  I noted an adhesion to the  small bowel and the  anterior abdominal wall, and the anterior abdominal  wall, and the incisional hernia in the periumbilical region could be seen.  A 5 mm trocar was then placed in the right lower quadrant and using sharp  dissection, I was able to lyse most of the adhesions.  I then placed a 5 mm  trocar in the left lower quadrant and provided countertraction, gently on  the small bowel, and I was able to lyse the adhesions of the small bowel to  the anterior abdominal wall through the entire __________ incision and  superior to it, freeing the small bowel.  I inspected the small bowel  carefully, and no enterotomies were noted.   Next, I mobilized the falciform ligament and pushed it a little bit  superior, allowing for an adequate rim of normal fascia around the hernia  defect.  I then placed spinal needles at the periphery of the defect in  four  quadrants and measured 3-4 cm back and this drawing a large oval and  measured approximately 15 x 19 cm.  The defect was approximately 9 x 7 cm.   A piece of adequate mesh, nonadhesive barrier side was brought onto the  field and stay sutures placed in the four quadrants.  The mesh was hydrated  and then passed into the abdominal cavity and then unrolled with the  nonadhesive side facing the viscera, and the adhesive side of the  polypropylene mesh facing the intra-abdominal wall.  Stab incisions were  made in four quadrants around the periphery of the hernia, and the stay  sutures were grasped across the fascial bridge and brought up through the  anterior abdominal wall in four quadrants.  The mesh was then elevated and  anchored to the anterior abdominal wall by tying down the stay sutures.  Further anchoring was performed by using spiral tacking.  I inserted one  more 10 mm trocar in the left lateral abdomen and one more 5 mm trocar in  the left lateral abdomen to perform this.  An outer and inner rim of tacks  were used to anchor the mesh which more than  adequately covered the defect  and had just a mild amount of laxity, so it was not under tension.   Next, I inspected the area deep to this.  No bleeding was noted.  I then  removed the trocars in the right side of the abdomen.  I partially closed  the fascial defect by tying down the two anchoring sutures in the right mid  lateral abdominal wall, then closed the rest of the fasc defect with an  Endoclose device and a 0 Vicryl suture.  The remaining trocars were removed,  and the pneumoperitoneum was released.   All skin incisions were closed with 4-0 Monocryl subcuticular stitches  followed by Steri-Strips and sterile dressings.   She tolerated the procedure well without any apparent complications.  She  subsequently was extubated and taken to the recovery room in satisfactory  condition.                                               Adolph Pollack, M.D.    Kari Baars  D:  12/15/2003  T:  12/15/2003  Job:  [id number]   cc:   De Blanch, M.D.   Wil[facility] Floor  [address]  [place]  [place] [address]  Fax: [phone]

## 2011-02-15 NOTE — Consult Note (Signed)
NAMESTEPHINIE, [name]               ACCOUNT NO.:  [id number]   MEDICAL RECORD NO.:  [id number]          PATIENT TYPE:  OUT   LOCATION:  GYN                          FACILITY:  [facility]   PHYSICIAN:  De Blanch, M.D.DATE OF BIRTH:  1951-04-05   DATE OF CONSULTATION:  07/17/2004  DATE OF DISCHARGE:                                   CONSULTATION   This 60 year old white female returns for continued followup of ovarian  cancer.   INTERVAL HISTORY:  Sine her last visit, the patient has done well from a  gynecologic point of view.  She denies any pelvic pain, pressure, vaginal  bleeding, or discharge.   She does complain of lower extremity cramps, pain in her feet.  She was  scheduled to see a neurologist but canceled because she developed an upper  respiratory tract infection.  She notes that she had some of these symptoms  occur while she was receiving chemotherapy, but then they resolved and only  returned after a recent surgery (laparoscopic repair of ventral incisional  hernia with mesh).   HISTORY OF PRESENT ILLNESS:  The patient underwent staging for stage II-B  ovarian cancer and then was treated on GOG protocol #175, completing her  maintenance Taxol chemotherapy in July 2003.   CURRENT MEDICATIONS:  Premarin; triamterene/hydrochlorothiazide; Sinemet;  Citracal; Prilosec; Allegra-D; Ambien; and Aleve p.r.n.   FAMILY HISTORY:  Negative for gynecologic, breast, or colon cancer.   SOCIAL HISTORY:  The patient is married.  She does not smoke.   REVIEW OF SYSTEMS:  Negative, except as noted above.   PHYSICAL EXAMINATION:  WEIGHT:  168 pounds.  BLOOD PRESSURE:  128/90.  GENERAL:  The patient is a healthy white female in no acute distress.  HEENT:  Negative.  NECK:  Supple, without thyromegaly.  LYMPHATIC:  There is no supraclavicular or inguinal adenopathy.  ABDOMEN:  Soft, nontender.  No masses, organomegaly, ascites, or hernias are  noted.  PELVIC:  EGBUS, vagina,  bladder, and urethra are normal.  Bimanual and  rectovaginal exam reveal no masses, induration, or nodularity.  Cervix and  uterus are surgically absent.  Rectovaginal exam confirms.  LOWER EXTREMITIES:  Without edema or varicosities.   IMPRESSION:  Stage II-B ovarian cancer.  No evidence of recurrent disease.   Recent CA-125 (July 02, 2004) was 8.  The patient is encouraged to see a  neurologist regarding her cramps and pains in her lower extremities.  I do  not believe these are associated with the use of Taxol, as they are not a  classic peripheral neuropathy.  She will return to see Dr. Darnelle Catalan in six  months, and return to see [place] in one year.     Dani   DC/MEDQ  D:  07/17/2004  T:  07/17/2004  Job:  [id number]   cc:   [name], M.D.  [address] [facility]  [place]  [place] [address]  Fax: [phone]   Wilmer Floor  [address]  [place]  [place] [address]  Fax: [phone]   Adolph Pollack, M.D.  [address], [address]  [place]  [place] [address]  Fax: [phone]   Telford Nab, R.N.  [phone] [address]  [place], [place] [address]

## 2011-02-15 NOTE — Consult Note (Signed)
[facility]  Patient:    [name], [name] Visit Number: [id number] MRN: [id number]          Service Type: GON Location: GYN Attending Physician:  Jeannette Corpus Dictated by:   Rande Brunt. Clarke-Pearson, M.D. Proc. Date: 07/21/01 Admit Date:  07/21/2001   CC:         Almedia Balls. Randell Patient, M.D.  Telford Nab, R.N.   Consultation Report  HISTORY OF PRESENT ILLNESS:  This is a 60 year old white female who returns one week postop for consultation regarding treatment plan. She underwent exploratory laparotomy on July 14, 2001, for evaluation of a pelvic mass. 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 Surgical staging revealed that she had surface involvement of the ovary, focal involvement of the Fallopian tube, and metastasis to the sigmoid colon and left pelvic sidewall. No gross diseases remain. All of her additional biopsies and 27 lymph nodes, peritoneal washings, and diaphragm scrape were all negative. Therefore, the patients metastases was stage IIB, grade 1 adenocarcinoma of the ovary. She had an uncomplicated postoperative course.  PHYSICAL EXAMINATION:  ABDOMEN:  Soft, nontender. No masses, organomegaly, ascites or hernias noted. Midline incision is healing well. Staples are removed and Steri-Strips are applied.  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 I had a lengthy discussion with the patient and her husband regarding surgical findings, pathologic findings and staging and advised them that adjuvant therapy would be recommended. It appears the current standard of care is to give three cycles of carboplatin/taxol chemotherapy as per GOG protocol. They are informed that there is a randomized trial today available where one arm received three cycles of carboplatin/taxol and the other arm receives carboplatin/taxol for three cycles followed by 24 smaller doses of  weekly Taxol.  The rationale for this protocol and the concept for randomized trials is discussed at length. The patient and her husbands questions are answered. They seem to be knowledgeable about this program and will consider the GOG protocol. They will return to see [place] in one week for further discussion regarding the protocol and finalization of management plans. In the interval, she will continue to recover from her surgery. She is given a renew prescription for hydromorphone 2 mg, 1-2 tablets q.4h. p.r.n. pain. Dictated by:   Rande Brunt. Clarke-Pearson, M.D. Attending Physician:  Jeannette Corpus DD:  07/21/01 TD:  07/22/01 Job: [id number]

## 2011-02-15 NOTE — Consult Note (Signed)
[facility]  Patient:    [name], [name] Visit Number: [id number] MRN: [id number]          Service Type: GON Location: GYN Attending Physician:  Sabino Donovan Dictated by:   Jackquline Denmark. Kyla Balzarine, M.D. Proc. Date: 07/07/01 Admit Date:  07/07/2001   CC:         Almedia Balls. Randell Patient, M.D.  Telford Nab, R.N.   Consultation Report  HISTORY OF PRESENT ILLNESS:  [name] is a 60 year old para 2 woman referred by Dr. Randell Patient for evaluation of pelvic mass.  HISTORY:  The patient relates approximately a 3-week history of rather abrupt-onset left lower abdominal pain. She notes "pressure on the colon" with tenesmus on rare occasion and over the past week has noted increasing "gas bloats." Of note, she had a prior TVH with A/P repair but ovaries were retained. She was evaluated by Dr. Randell Patient with an ultrasound obtained to confirm the findings of a left adnexal mass. She had a 6.3 x 6.2 x 7.8 multicystic left adnexal mass with solid areas. Apparently no free fluid was noted. CA125 value is 170 ____upremel.  PAST MEDICAL HISTORY:  Significant for no major medical comorbidities other than mild, chronic hypertension and borderline problems with weight.  PAST SURGICAL HISTORY:  Includes left-knee surgery in 1989 and vaginal hysterectomy with A/P repair in the remote past for benign disease. She has undergone several sigmoidoscopies and colonoscopic evaluations.  CURRENT MEDICATIONS: 1. Premarin 0.3 mg 2. Triamterene/HCTZ 37.5/25, using 1/2 tablet daily. 3. Phentermine 37.5 mg daily. 4. APAP/hydrocodone. 5. Darvocet N-100 p.r.n. pain. 6. Multivitamins, including vitamin E, are taken daily. 7. Allegra. 8. Beconase nasal spray. 9. Prilosec p.r.n.  ALLERGIES:  NONE KNOWN.  SOCIAL HISTORY:  The patient is married and denies tobacco or ethanol use.  FAMILY MEDICAL HISTORY:  Significant for paternal grandmother and paternal aunt with colon cancers but no breast or  gynecologic malignancies.  REVIEW OF SYSTEMS:  The patient denies systemic symptoms such as fevers, chills, weight loss, diminished appetite, but is significantly anxious regarding this process. ENT:  Denies recent URI symptoms or sinus problems; season allergies as noted above. CARDIOPULMONARY:  Denies dyspnea on exertion, PND, orthopnea, heart murmur, or arrhythmia, dependent edema, or chest pain. Denies tuberculosis exposure or pneumonia. GI:  Symptoms as above. GU:  Denies urinary urgency, frequency, hematuria, frequent UTIs or renal lithiasis. MUSCULOSKELETAL:  Prior left knee surgery as per HPI. HEME/LYMPH:  No abnormal bleeding or lymphadenopathy. SKIN:  No unusual lesions. ENDOCRINE:  No symptoms of hyperglycemia or thyroid disease. NEUROLOGIC: Denies seizures, strokes, blackouts, spells, severe headaches or neurologic loss.  PHYSICAL EXAMINATION:  GENERAL:  The patient is alert and oriented x3, in no acute distress.  VITAL SIGNS:  Weight 148 pounds. Blood pressure 148/86, pulse 90, respirations 18, temperature afebrile.  HEENT:  Benign with clear oropharynx.  NECK:  Supple, without goiter. There is no pathological lymphadenopathy.  LUNGS:  Clear to percussion and auscultation.  HEART:  Regular rate and rhythm without murmur, gallop, or JVD. Peripheral pulses are intact.  BREASTS:  Deferred as the patient states that she is up-to-date with this and mammogram.  ABDOMEN:  Soft, benign. Without ascites, mass, organomegaly, or hernia.  EXTREMITIES:  There is no back or CVA tenderness. Extremities have full strength and range of motion without edema.  PELVIC:  External genitalia and BUS are normal to inspection and palpation. The bladder and urethra are well supported with normal vaginal mucosa. Bimanual and rectovaginal examinations feel  absent uterus and cervix. There is a modestly tender, 7-cm irregular and partially cystic left pelvic mass. The cul-de-sac is without  nodularity. There are no right adnexal abnormalities, and there are no intrinsic rectal lesions.  ASSESSMENT:  Cystic mass with elevated CA125. Differential diagnoses includes adnexal mass with torsion, endometriosis, or possibly ovarian cancer.  PLAN:  I had a lengthy discussion with the patient and husband regarding this mass. It has been recommended by Dr. Randell Patient that she undergo an exploratory laparotomy with BSO, and I am in complete agreement. Frozen section would be used to document whether this is a benign or malignant process, and if malignancy were encountered, we would be prepared to perform debulking and comprehensive staging with multiple biopsies and washings, omentectomy, and bilateral pelvic and aortic lymphadenectomies. I answered multiple questions after discussing risks of bleeding, infection, damage to bowel or urinary system, thromboembolic complications, etc. The patient and husband understand risks and benefits of surgery and wish to proceed. Dictated by:   Jackquline Denmark. Kyla Balzarine, M.D. Attending Physician:  Ronita Hipps T DD:  07/07/01 TD:  07/07/01 Job: [id number]

## 2011-02-15 NOTE — H&P (Signed)
[facility]  Patient:    [name], [name] Visit Number: [id number] MRN: [id number]          Service Type: GON Location: GYN Attending Physician:  Sabino Donovan Dictated by:   Almedia Balls. Randell Patient, M.D. Admit Date:  07/07/2001 Discharge Date: 07/07/2001                           History and Physical  CHIEF COMPLAINT:  Pain, pelvic mass.  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  The ultrasound showed multiloculated cystic areas.  A CA 125 was obtained which showed a level of 170.  She has been fully counseled as to the nature of the problem and the need for surgery to evaluate and treat this problem.  The procedure indicated is exploratory laparotomy, bilateral salpingo-oophorectomy, possible omentectomy, possible lymph node dissection. She has been fully counseled as to the procedure and the risks involved to include risk of anesthesia, injury to bowel, bladder, blood vessels, ureters, postoperative hemorrhage, infection, recuperation.  She fully understands all these considerations and wishes to proceed on July 14, 2001.  PAST MEDICAL HISTORY: 1. Hysterectomy, as noted above. 2. Tubal ligation in January 1990. 3. Tonsillectomy in approximately 1964. 4. Knee surgery in 1989.  ALLERGIES:  She is allergic to no medications.  MEDICATIONS: 1. She has taken only Darvocet-N 100 recently for pain. 2. She had had some Premarin 0.3 mg for some mild hot flashes recently as    well.  FAMILY HISTORY:  Paternal grandmother and paternal aunt with colon cancer. Otherwise, family history is noncontributory.  REVIEW OF SYSTEMS:  HEENT:  Negative.  CARDIORESPIRATORY:  Negative. GASTROINTESTINAL:  As in present illness.  GENITOURINARY:  As in present illness.   NEUROMUSCULAR:  Negative.  PHYSICAL EXAMINATION:  VITAL SIGNS:  Height 5 feet 6-3/4 inches, weight 154 pounds.  Blood pressure 132/80, pulse 72, respirations 18.  GENERAL:  Well-developed white female in no acute distress.  HEENT:  Within normal limits.  NECK:  Supple.  Without masses, adenopathy, or bruits.  HEART:  Regular rate and rhythm, without murmurs.  LUNGS:  Clear to P&A.  BREASTS:  Sitting and lying without mass.  Axilla negative.  ABDOMEN:  Flat and soft, with tenderness in both lower quadrants, left greater than right.  PELVIC:  External genitalia, Bartholin, urethral, and Skenes glands within normal limits.  Vagina is clean.  Cervix and uterus are absent.  Bimanual exam reveals mass effect, particularly on the left, which is very tender.  RECTOVAGINAL:  Confirmatory.  EXTREMITIES:  Within normal limits.  CENTRAL NERVOUS SYSTEM:  Grossly intact.  SKIN:  Without suspicious lesions.  IMPRESSION:  Pelvic mass.  DISPOSITION:  As noted above.  Of note is that a Pap smear was normal in July 2002. Dictated by:   Almedia Balls Randell Patient, M.D. Attending Physician:  Ronita Hipps T DD:  07/09/01 TD:  07/10/01 Job: [id number]

## 2011-02-15 NOTE — Consult Note (Signed)
NAME:  Sarah, Mccann NO.:  [id number]   MEDICAL RECORD NO.:  [id number]                   PATIENT TYPE:  OUT   LOCATION:  GYN                                  FACILITY:  [facility]   PHYSICIAN:  De Blanch, M.D.         DATE OF BIRTH:  06-09-1951   DATE OF CONSULTATION:  10/13/2002  DATE OF DISCHARGE:                                   CONSULTATION   A 60 year old white female returns for continuing followup.  She has a Stage  II-B ovarian carcinoma treated with surgical staging and adjuvant  chemotherapy on GOG protocol #175.  She received adjuvant Taxol therapy  which was completed in July 2003.   Since her last visit, she has had several symptoms including dysuria, and  pain and pressure in the bladder.  She has been treated with Cipro on two  courses without any significant relief.  She also uses peridium with some  relief.  She allegedly has had a urine culture in Dr. Delorse Lek office which  was negative.  She denies any vaginal discharge.   She has  no vaginal bleeding or discharge, pelvic pain, or other symptoms  except for occasional nausea when she takes Celebrex.  She is using Premarin  0.65 mg daily as well as Premarin cream three times a week.  She does have  some discomfort in her ventral hernia without any extreme pain, nausea,  vomiting, or cramps.   Her second significant symptoms are those of pain in her elbows and leg  joints.  She had previously been placed on Celebrex by Dr. Darnelle Catalan.  The  patient has taken this p.r.n. with some relief although she notes nausea  associated with the use of Celebrex.   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  She has no cardiovascular, pulmonary  symptoms.   CURRENT MEDICATIONS:  Celebrex 100 mg 2 q.d., Allegra 180 mg p.r.n., Ambien  10 mg approximately three times a week at h.s., Senna-S p.r.n. (about every  other day),  Celexa 20 mg (the patient takes this on a p.r.n. basis),  Prilosec 40 mg p.r.n., triamterene hydrochlorothiazide 25, Premarin 0.625  mg, phentermine 37.5 mg daily (being managed by Dr. Randell Patient).   FAMILY HISTORY/SOCIAL HISTORY:  Reviewed and otherwise unchanged.  She comes  accompanied by her husband today.   PHYSICAL EXAMINATION:  GENERAL:  The patient is a healthy white female, no  acute distress.  VITAL SIGNS:  Weight 164 pounds.  Blood pressure 120/82.  Functional status,  grade zero.  HEENT:  Negative.  NECK:  Supple without thyromegaly.  There was no supraclavicular, axillary,  or inguinal adenopathy.  ABDOMEN:  Soft, nontender, no mass, no hepatomegaly.  No ascites are noted.  She does have a small ventral hernia at the upper apex of her midline  incision.  PELVIC EXAM:  EG/BUS.  Vagina, bladder, urethra seem  normal except for a  yeast infection on the vaginal walls.  RECTOVAGINAL:  Bimanual and rectovaginal exam revealed no masses,  induration, or nodularity.  EXTREMITIES:  Lower extremities without edema or varicosities.   IMPRESSION:  1. Stage II-B ovarian cancer, no evidence of recurrent disease.  CA-125 and     protocol laboratory work obtained today.  Probable yeast vaginitis.  The     patient is encouraged to use Monistat cream.  2. 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  Given     the patient's intolerance to Celebrex, we have given her a prescription     for Vioxx 50 mg daily.  3. I also believe the patient has a significant component of depression, and     she is instructed that she should use the Celexa daily as prescribed     rather than a p.r.n. basis.  4. The patient will return to see me as per dictated by protocol in three     months.                                               De Blanch, M.D.    DC/MEDQ  D:  10/13/2002  T:  10/13/2002  Job:  [id number]   cc:   [name], M.D.  [address] [facility]   [place]  [place] [address]  Fax: [phone]   Almedia Balls. Fore, M.D.  [phone] [address] [place]  [place] [address]  Fax: [phone]   Telford Nab, R.N.  [address] [place], [place] [address]  Fax: [phone]

## 2011-02-15 NOTE — Consult Note (Signed)
[name], [name]               ACCOUNT NO.:  [id number]   MEDICAL RECORD NO.:  [id number]          PATIENT TYPE:  OUT   LOCATION:  GYN                          FACILITY:  [facility]   PHYSICIAN:  De Blanch, M.D.DATE OF BIRTH:  03/12/1951   DATE OF CONSULTATION:  07/12/2005  DATE OF DISCHARGE:                                   CONSULTATION   A 60 year old white female returns for continued follow-up of ovarian  cancer.   INTERVAL HISTORY:  Since her last visit, she denies any GI or GU symptoms;  has no pelvic pain, pressure, vaginal bleeding. Her main complaint is that  of hip pain and leg pain. She does have minimal amount of peripheral  neuropathy in her feet but this hip pain seems to be associated with  increase in activity and that she has moved into a new house recently and  she has been physically active. She has been using Advil three tablets at a  time for pain relief but has not really had any relief. She has had a DEXA  scan and she tells me that she that there is no evidence of osteoporosis.  She had a mammogram 3 months ago that was negative.   HISTORY OF PRESENT ILLNESS:  The patient has stage IIb ovarian cancer  treated on GOG protocol #175. She completed her maintenance Taxol  chemotherapy in July 2003. 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   CURRENT MEDICATIONS:  Triamterene hydrochlorothiazide, Vivelle 0.5 mg,  Sinemet, Citracal, Prilosec Allegra D, Ambien p.r.n. but nearly nightly,  Aleve p.r.n.   FAMILY HISTORY:  Is negative gynecologic, breast, or colon cancer.   SOCIAL HISTORY:  The patient is married. She does not smoke.   REVIEW OF SYSTEMS:  Comprehensive review of systems including 10 systems is  reviewed and is negative except for symptoms noted above.   PHYSICAL EXAMINATION:  VITAL SIGNS:  Weight 165 pounds (stable), blood  pressure 134/84.  GENERAL:  The patient is a healthy white female in no acute distress.  HEENT:   Negative.  NECK:  Supple without thyromegaly.  LYMPH:  There is no supraclavicular or inguinal adenopathy.  ABDOMEN:  Soft, nontender. No mass, organomegaly, ascites, or hernias are  noted.  PELVIC:  EG/BUS, vagina, bladder, urethra are normal, well supported, and no  lesions are noted. Bimanual and rectovaginal exam reveal no mass,  induration, or nodularity.  LOWER EXTREMITIES:  Without edema or varicosities.   IMPRESSION:  Stage IIb ovarian cancer. The patient had a CA-125 on October 9  which was 3.9 units/mL. All of the remainder of her CBC as well as  electrolytes and liver function were normal, as well as a urinalysis.   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 I would recommend  the patient see her primary care physician, Dr. Olena Leatherwood, for further  evaluation. She will return to see Dr. Darnelle Catalan for the next oncology visit  as prescribed by GOG protocol #175.      De Blanch, M.D.  Electronically Signed     DC/MEDQ  D:  07/12/2005  T:  07/12/2005  Job:  [id number]   cc:   [name], M.D.  Fax: [phone]   [address] Floor  Fax: [phone]   Adolph Pollack, M.D.  [address]  [place]  [place] [address]   Telford Nab, R.N.  [phone] [address]  [place], [place] [address]

## 2011-02-15 NOTE — Consult Note (Signed)
[facility]  Patient:    [name], [name] Visit Number: [id number] MRN: [id number]          Service Type: GON Location: GYN Attending Physician:  Jeannette Corpus Dictated by:   Rande Brunt. Clarke-Pearson, M.D. Admit Date:  10/20/2001   CC:         [name], M.D.             Almedia Balls. Randell Patient, M.D.             Telford Nab, R.N.                          Consultation Report  HISTORY OF PRESENT ILLNESS:  This is a 60 year old white female who returns for continuing followup and continuing chemotherapy of a stage II B ovarian cancer initially diagnosed at the time of surgery on July 14, 2001. The patient subsequently has been treated on GOG protocol 175, having completed 3 cycles of a combination carboplatin and Taxol. She is now beginning the 24 weeks of prescribed Taxol therapy. She has tolerated her prior therapy reasonably well with expected alopecia. She does have some achiness in the joints and long bones after the administration of Taxol. Further, she has mild peripheral neuropathy in her feet and none in her hands.  She denies any GI or GU symptoms; has no pelvic pain or pressure, vaginal bleeding or discharge. Her functional status is excellent.  REVIEW OF SYSTEMS:  Otherwise negative.  FAMILY HISTORY/SOCIAL HISTORY:  Are reviewed and unchanged. She comes accompanied by her husband today.  PHYSICAL EXAMINATION:  VITAL SIGNS:  Weight 150 pounds, blood pressure 130/80, pulse 88, respiratory rate 20.  GENERAL:  The patient is a healthy white female in no acute distress.  HEENT:  Reveals alopecia.  NECK:  Supple without thyromegaly. There is no supraclavicular or inguinal adenopathy.  ABDOMEN:  Soft, nontender.  No masses, splenomegaly, ascites or hernias noted.  PELVIC:  EG/BUS normal. Vagina is clean, well-supported. Bimanual rectal examination revealed no masses, induration, or nodularity.  IMPRESSION:  Stage  II B ovarian cancer, doing well on chemotherapy.  LABORATORY AND ACCESSORY DATA:  Laboratory work is reviewed. White count 6,100, hemoglobin 14.2, platelets 187,000.  PLAN:  The patient is okay to go ahead with reinstituting Taxol chemotherapy today. She will return to see Dr. Darnelle Catalan in 1 month and return to see me in 2 months. CO125 will be repeated.  The patient was given prescriptions for Vicodin, vitamin B-6, and Premarin 0.625 mg daily. Dictated by:   Rande Brunt. Clarke-Pearson, M.D. Attending Physician:  Jeannette Corpus DD:  10/20/01 TD:  10/21/01 Job: [id number]

## 2011-02-15 NOTE — Consult Note (Signed)
[facility]  Patient:    [name], [name] Visit Number: [id number] MRN: [id number]          Service Type: GON Location: GYN Attending Physician:  Jeannette Corpus Dictated by:   Rande Brunt. Clarke-Pearson, M.D. Proc. Date: 03/09/02 Admit Date:  03/09/2002 Discharge Date: 03/09/2002   CC:         Raymond Gurney C. Magrinat, M.D.  Almedia Balls. Randell Patient, M.D.  Telford Nab, R.N.   Consultation Report  A 60 year old white female returns for continuing follow-up of stage IIB ovarian carcinoma.  She is now nearing completion of her chemotherapy protocol on GOG protocol #175.  She has been receiving weekly Taxol and has three more doses to receive.  She reports she is tolerating this quite well.  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  She is having regrowth of her hair and her functional status is excellent.  She denies any GI or GU symptoms.  FAMILY HISTORY:  Reviewed and unchanged.  SOCIAL HISTORY:  Reviewed and unchanged.  REVIEW OF SYSTEMS:  Negative except as noted above.  PHYSICAL EXAMINATION  VITAL SIGNS:  Weight 162 pounds, blood pressure 130/70.  GENERAL:  The patient is a healthy white female in no acute distress.  HEENT:  Negative.  NECK:  Supple without thyromegaly.  NODES:  There is no supraclavicular or inguinal adenopathy.  ABDOMEN:  Soft, nontender.  No mass, organomegaly, ascites, or hernias are noted.  Midline incision is well healed.  PELVIC:  EGBUS normal.  Vagina is clean, well supported.  Bimanual and rectovaginal examinations reveal no mass, induration, or nodularity.  EXTREMITIES:  Lower extremities reveal minimal lower extremity edema on the right.  IMPRESSION:  Stage IIB ovarian cancer doing well, nearing completion of chemotherapy.  She will receive three more weeks of low dose Taxol.  We will plan on obtaining a CAT scan to establish a new baseline and then initiate follow-up examinations every  three months alternating with Dr. Darnelle Catalan.  All the patients questions are answered. Dictated by:   Rande Brunt. Clarke-Pearson, M.D. Attending Physician:  Jeannette Corpus DD:  03/09/02 TD:  03/11/02 Job: [id number]

## 2011-02-15 NOTE — Consult Note (Signed)
[facility]  Patient:    [name], [name] Visit Number: [id number] MRN: [id number]          Service Type: GON Location: GYN Attending Physician:  Jeannette Corpus Dictated by:   Rande Brunt. Clarke-Pearson, M.D. Proc. Date: 07/29/01 Admit Date:  07/29/2001   CC:         Almedia Balls. Randell Patient, M.D.             Telford Nab, R.N.             [name], M.D.                          Consultation Report  REASON FOR CONSULTATION:  [age]-year-old white female returns for further discussion regarding chemotherapy for treatment of a stage IIB, grade 1 adenocarcinoma of the ovary.  She has been previously presented with GOG protocol for consideration and she has thought about that this week.  She has a number of questions regarding the protocol and after those questions are answered, the patient indicates she would like to participate in the protocol, understanding that she could be randomized to either standard of care with 3 cycles of carboplatin and Taxol, or be randomized to the experimental arm, which would include 24 additional weekly cycles of Taxol.  She is aware that there could be some additional risks, especially of neuropathy, if she were assigned to receive the additional Taxol therapy.  The expected side-effects of carboplatin and Taxol were outlined to the patient, she is scheduled to see Dr. Raymond Gurney C. Magrinat next week and we will ask that the protocol nurse see the patient to have her sign a consent form.  We will have the patient back to see me in six weeks for a postoperative visit. Dictated by:   Rande Brunt. Clarke-Pearson, M.D. Attending Physician:  Jeannette Corpus DD:  07/29/01 TD:  07/30/01 Job: [id number]

## 2015-02-10 ENCOUNTER — Telehealth: Payer: Self-pay | Admitting: Oncology

## 2015-02-10 NOTE — Telephone Encounter (Signed)
[facility] [phone]

## 2015-03-27 ENCOUNTER — Other Ambulatory Visit: Payer: Self-pay | Admitting: Orthopaedic Surgery

## 2015-03-27 DIAGNOSIS — M4716 Other spondylosis with myelopathy, lumbar region: Secondary | ICD-10-CM

## 2015-03-28 ENCOUNTER — Ambulatory Visit
Admission: RE | Admit: 2015-03-28 | Discharge: 2015-03-28 | Disposition: A | Discharge status: Home / Self Care | Payer: 59 | Source: Ambulatory Visit | Attending: Orthopaedic Surgery | Admitting: Orthopaedic Surgery

## 2015-03-28 DIAGNOSIS — M4716 Other spondylosis with myelopathy, lumbar region: Secondary | ICD-10-CM

## 2015-03-28 MED ORDER — IOHEXOL 180 MG/ML  SOLN
1.0000 mL | Freq: Once | INTRAMUSCULAR | Status: AC | PRN
  Administered 2015-03-28: 1 mL via EPIDURAL

## 2015-03-28 MED ORDER — METHYLPREDNISOLONE ACETATE 40 MG/ML INJ SUSP (RADIOLOG
120.0000 mg | Freq: Once | INTRAMUSCULAR | Status: AC
  Administered 2015-03-28: 120 mg via EPIDURAL

## 2015-03-28 NOTE — Discharge Instructions (Signed)
